# Patient Record
Sex: Male | Born: 1945 | Race: White | Hispanic: No | Marital: Single | Smoking: Former smoker
Health system: Southern US, Community
[De-identification: ages and names within clinical notes are randomized; demographics above are authoritative.]

## PROBLEM LIST (undated history)

## (undated) DIAGNOSIS — E079 Disorder of thyroid, unspecified: Secondary | ICD-10-CM

## (undated) DIAGNOSIS — M1990 Unspecified osteoarthritis, unspecified site: Secondary | ICD-10-CM

## (undated) DIAGNOSIS — A770 Spotted fever due to Rickettsia rickettsii: Secondary | ICD-10-CM

## (undated) DIAGNOSIS — I10 Essential (primary) hypertension: Secondary | ICD-10-CM

## (undated) DIAGNOSIS — I2510 Atherosclerotic heart disease of native coronary artery without angina pectoris: Secondary | ICD-10-CM

## (undated) HISTORY — PX: CARDIAC SURGERY: SHX584

## (undated) HISTORY — PX: CORONARY ARTERY BYPASS GRAFT: SHX141

## (undated) HISTORY — PX: TONSILLECTOMY: SUR1361

## (undated) HISTORY — PX: APPENDECTOMY: SHX54

## (undated) HISTORY — PX: KNEE SURGERY: SHX244

---

## 2013-12-26 ENCOUNTER — Ambulatory Visit
Admission: RE | Admit: 2013-12-26 | Discharge: 2013-12-26 | Disposition: A | Discharge status: Home / Self Care | Payer: Commercial Managed Care - HMO | Source: Ambulatory Visit | Attending: Internal Medicine | Admitting: Internal Medicine

## 2013-12-26 ENCOUNTER — Other Ambulatory Visit: Payer: Self-pay | Admitting: Internal Medicine

## 2013-12-26 DIAGNOSIS — R05 Cough: Secondary | ICD-10-CM

## 2013-12-26 DIAGNOSIS — R059 Cough, unspecified: Secondary | ICD-10-CM

## 2015-12-21 ENCOUNTER — Encounter (HOSPITAL_COMMUNITY): Payer: Self-pay | Admitting: *Deleted

## 2015-12-21 ENCOUNTER — Emergency Department (HOSPITAL_COMMUNITY)
Admission: EM | Admit: 2015-12-21 | Discharge: 2015-12-21 | Disposition: A | Discharge status: Home / Self Care | Payer: Medicare HMO | Source: Home / Self Care | Attending: Emergency Medicine | Admitting: Emergency Medicine

## 2015-12-21 DIAGNOSIS — K0889 Other specified disorders of teeth and supporting structures: Secondary | ICD-10-CM | POA: Diagnosis not present

## 2015-12-21 HISTORY — DX: Atherosclerotic heart disease of native coronary artery without angina pectoris: I25.10

## 2015-12-21 MED ORDER — HYDROCODONE-ACETAMINOPHEN 5-325 MG PO TABS: 2.0000 | ORAL_TABLET | ORAL | Status: DC | PRN

## 2015-12-21 MED ORDER — CLINDAMYCIN HCL 300 MG PO CAPS: ORAL_CAPSULE | ORAL | Status: DC

## 2015-12-21 NOTE — ED Notes (Signed)
Pt  Has  Pain  Swelling   Tenderness  To  r  Side  Face  Tongue  Is   Swelling  As   Well     Pt  Reports  A  Filling came  Out     And    He has     Pain  And  Swelling  Evident   -  He  Is  sitting  Upright on  The  Exam table  Speaking in   Complete  sentances  And  Is  In no    Severe  Distress

## 2015-12-21 NOTE — ED Provider Notes (Signed)
CSN: [id number]     Arrival date & time 12/21/15  1300 History   First MD Initiated Contact with Patient 12/21/15 1322     Chief Complaint  Patient presents with  . Dental Problem   (Consider location/radiation/quality/duration/timing/severity/associated sxs/prior Treatment) Patient is a 70 y.o. male presenting with tooth pain. The history is provided by the patient. No language interpreter was used.  Dental Pain Location:  Lower Lower teeth location:  30/RL 1st molar and 31/RL 2nd molar Quality:  Sharp Severity:  Moderate Onset quality:  Sudden Duration:  0 days Timing:  Constant Progression:  Worsening Chronicity:  New Previous work-up:  Dental exam Relieved by:  Nothing Worsened by:  Nothing tried Ineffective treatments:  None tried Associated symptoms: facial swelling   Associated symptoms: no congestion   Risk factors: lack of dental care   Risk factors: no chewing tobacco use     Past Medical History  Diagnosis Date  . Coronary artery disease    Past Surgical History  Procedure Laterality Date  . Cardiac surgery     History reviewed. No pertinent family history. Social History  Substance Use Topics  . Smoking status: Never Smoker   . Smokeless tobacco: None  . Alcohol Use: Yes    Review of Systems  HENT: Positive for facial swelling. Negative for congestion.   All other systems reviewed and are negative.   Allergies  Review of patient's allergies indicates no known allergies.  Home Medications   Prior to Admission medications   Medication Sig Start Date End Date Taking? Authorizing Provider  ASPIRIN PO Take by mouth.   Yes Historical Provider, MD  LEVOTHYROXINE SODIUM PO Take by mouth.   Yes Historical Provider, MD  LISINOPRIL PO Take by mouth.   Yes Historical Provider, MD  PREDNISONE PO Take by mouth.   Yes Historical Provider, MD  Rosuvastatin Calcium (CRESTOR PO) Take by mouth.   Yes Historical Provider, MD  clindamycin (CLEOCIN) 300 MG capsule  One po qid 12/21/15   Elson AreasLeslie K Herminia Warren, PA-C  HYDROcodone-acetaminophen (NORCO/VICODIN) 5-325 MG tablet Take 2 tablets by mouth every 4 (four) hours as needed. 12/21/15   Elson AreasLeslie K Story Conti, PA-C   Meds Ordered and Administered this Visit  Medications - No data to display  BP 157/89 mmHg  Pulse 86  Temp(Src) 98 F (36.7 C) (Oral)  Resp 16  SpO2 100% No data found.   Physical Exam  Constitutional: He appears well-developed and well-nourished.  HENT:  Head: Normocephalic and atraumatic.  Right Ear: External ear normal.  Left Ear: External ear normal.  Nose: Nose normal.  Mouth/Throat: Oropharynx is clear and moist.  Eyes: Pupils are equal, round, and reactive to light.  Neck: Normal range of motion.  Cardiovascular: Normal rate.   Pulmonary/Chest: Effort normal.  Abdominal: Soft.  Musculoskeletal: Normal range of motion.  Neurological: He is alert.  Skin: Skin is warm.  Nursing note and vitals reviewed.   ED Course  Procedures (including critical care time)  Labs Review Labs Reviewed - No data to display  Imaging Review No results found.   Visual Acuity Review  Right Eye Distance:   Left Eye Distance:   Bilateral Distance:    Right Eye Near:   Left Eye Near:    Bilateral Near:         MDM   1. Toothache    See your Dentist tomorrow for evaluation Meds ordered this encounter  Medications  . LISINOPRIL PO    Sig: Take by  mouth.  . Rosuvastatin Calcium (CRESTOR PO)    Sig: Take by mouth.  Marland Kitchen LEVOTHYROXINE SODIUM PO    Sig: Take by mouth.  Marland Kitchen PREDNISONE PO    Sig: Take by mouth.  . ASPIRIN PO    Sig: Take by mouth.  . clindamycin (CLEOCIN) 300 MG capsule    Sig: One po qid    Dispense:  28 capsule    Refill:  0    Order Specific Question:  Supervising Provider    Answer:  Linna Hoff [phone]  . HYDROcodone-acetaminophen (NORCO/VICODIN) 5-325 MG tablet    Sig: Take 2 tablets by mouth every 4 (four) hours as needed.    Dispense:  10 tablet     Refill:  0    Order Specific Question:  Supervising Provider    Answer:  Linna Hoff [phone]  An After Visit Summary was printed and given to the patient.   Lonia Skinner [facility], PA-C 12/21/15 1351  Lonia Skinner [place], [place] 12/21/15 1352

## 2015-12-21 NOTE — Discharge Instructions (Signed)

## 2016-09-07 ENCOUNTER — Other Ambulatory Visit: Payer: Self-pay | Admitting: Internal Medicine

## 2016-09-07 DIAGNOSIS — R519 Headache, unspecified: Secondary | ICD-10-CM

## 2016-09-07 DIAGNOSIS — R51 Headache: Principal | ICD-10-CM

## 2016-09-08 ENCOUNTER — Other Ambulatory Visit: Payer: Self-pay | Admitting: Internal Medicine

## 2016-09-08 DIAGNOSIS — R519 Headache, unspecified: Secondary | ICD-10-CM

## 2016-09-08 DIAGNOSIS — R42 Dizziness and giddiness: Secondary | ICD-10-CM

## 2016-09-08 DIAGNOSIS — R51 Headache: Secondary | ICD-10-CM

## 2016-09-09 ENCOUNTER — Ambulatory Visit
Admission: RE | Admit: 2016-09-09 | Discharge: 2016-09-09 | Disposition: A | Discharge status: Home / Self Care | Payer: Medicare HMO | Source: Ambulatory Visit | Attending: Internal Medicine | Admitting: Internal Medicine

## 2016-09-09 ENCOUNTER — Other Ambulatory Visit: Payer: Medicare HMO

## 2016-09-09 DIAGNOSIS — R42 Dizziness and giddiness: Secondary | ICD-10-CM

## 2016-09-09 DIAGNOSIS — R51 Headache: Secondary | ICD-10-CM

## 2016-09-09 DIAGNOSIS — R519 Headache, unspecified: Secondary | ICD-10-CM

## 2017-02-14 ENCOUNTER — Encounter (HOSPITAL_COMMUNITY): Payer: Self-pay

## 2017-02-14 DIAGNOSIS — Z5321 Procedure and treatment not carried out due to patient leaving prior to being seen by health care provider: Secondary | ICD-10-CM | POA: Insufficient documentation

## 2017-02-14 DIAGNOSIS — M79604 Pain in right leg: Secondary | ICD-10-CM | POA: Insufficient documentation

## 2017-02-14 DIAGNOSIS — M79605 Pain in left leg: Secondary | ICD-10-CM | POA: Insufficient documentation

## 2017-02-14 NOTE — ED Triage Notes (Signed)
Pt states that he is having pain in bilateral legs from the hips down, pt also has knot on the back of his R calf, no redness or warmth noted. Pt also reports having pain in lower left arm area.

## 2017-02-15 ENCOUNTER — Emergency Department (HOSPITAL_COMMUNITY)
Admission: EM | Admit: 2017-02-15 | Discharge: 2017-02-15 | Disposition: A | Discharge status: Home / Self Care | Payer: Medicare HMO | Attending: Dermatology | Admitting: Dermatology

## 2017-02-15 HISTORY — DX: Disorder of thyroid, unspecified: E07.9

## 2017-02-15 HISTORY — DX: Spotted fever due to Rickettsia rickettsii: A77.0

## 2017-02-15 HISTORY — DX: Unspecified osteoarthritis, unspecified site: M19.90

## 2017-02-15 HISTORY — DX: Essential (primary) hypertension: I10

## 2017-02-15 NOTE — ED Notes (Signed)
Pt walked up to [facility] and stated that he was going to see his doctors in the morning he is tired of waiting, pt was told he was in a room they was cleaning it and they would come get him, he stated "no thanks" and walked out.

## 2018-07-17 IMAGING — CT CT HEAD W/O CM
1 series · 16 of 30 positions shown, 20 images · non-contrast
Comparison: None.

CLINICAL DATA: 70-year-old male with right temporal headache for
several years following injury.

EXAM:
CT HEAD WITHOUT CONTRAST
TECHNIQUE: Contiguous axial images were obtained from the base of the skull
through the vertex without intravenous contrast.

[Series 2: head w/(date) · axial · 0.48mm/px · z∈[+81,+216]mm · 16 of 31 slices shown, 20 images]
[im 2/31  brain]
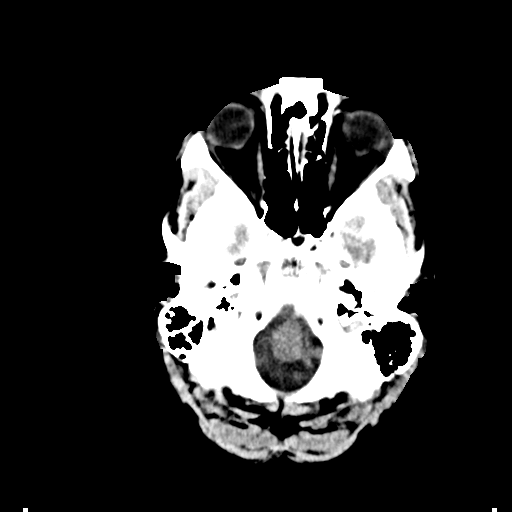
[im 2/31  bone]
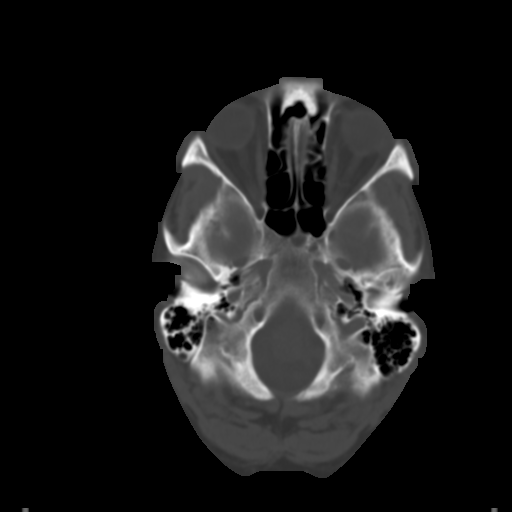
[im 4/31  brain]
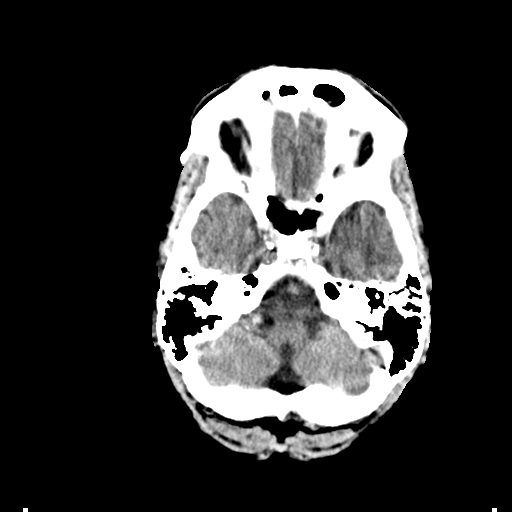
[im 6/31  brain]
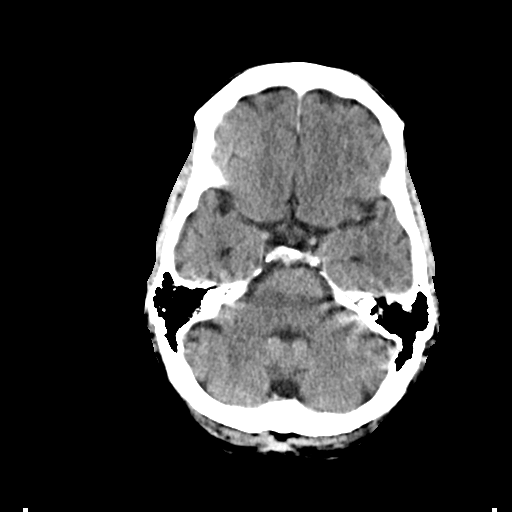
[im 8/31  brain]
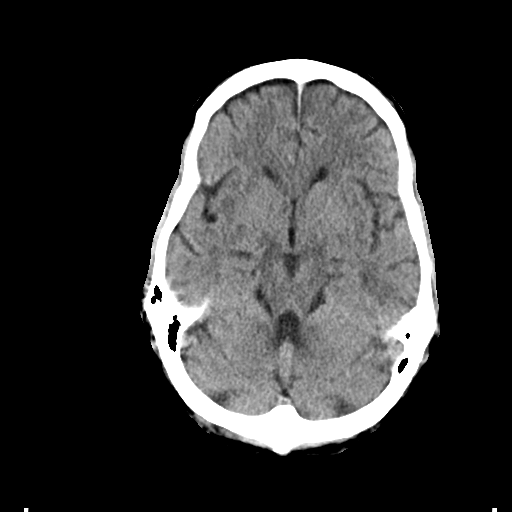
[im 9/31  brain]
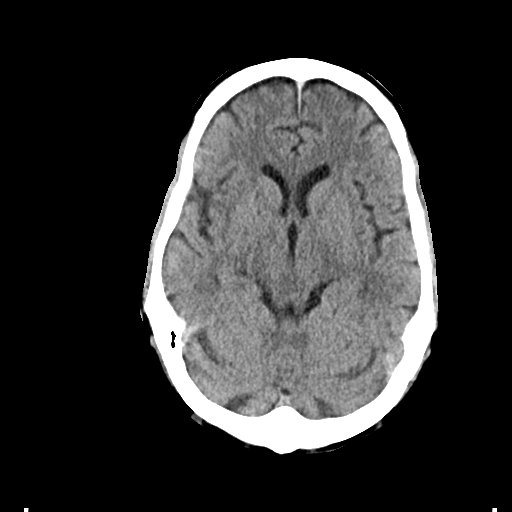
[im 9/31  bone]
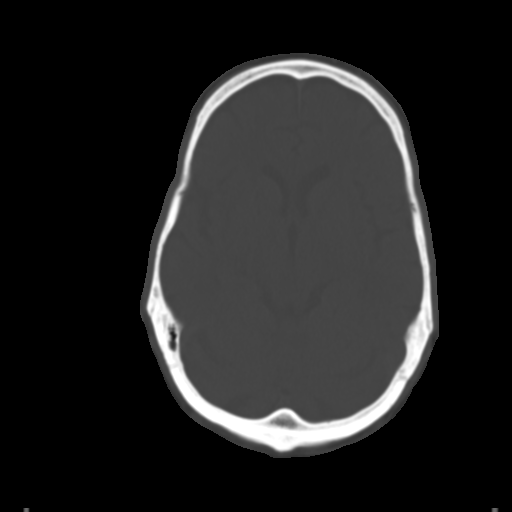
[im 11/31  brain]
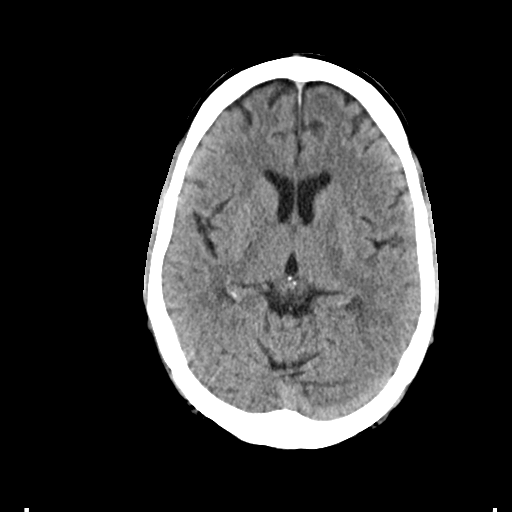
[im 13/31  brain]
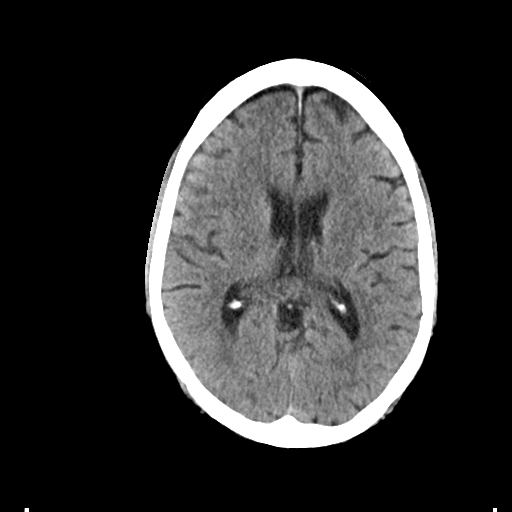
[im 15/31  brain]
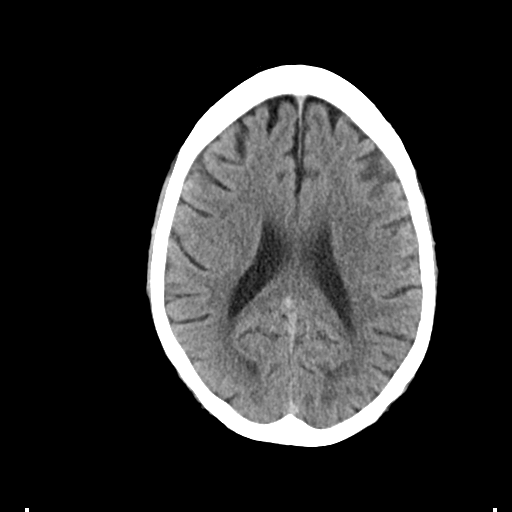
[im 16/31  brain]
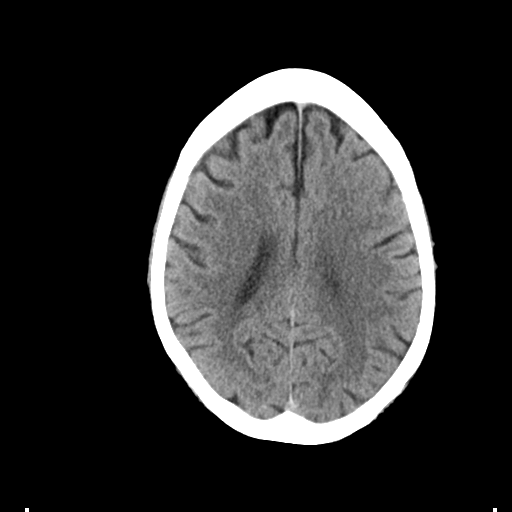
[im 16/31  bone]
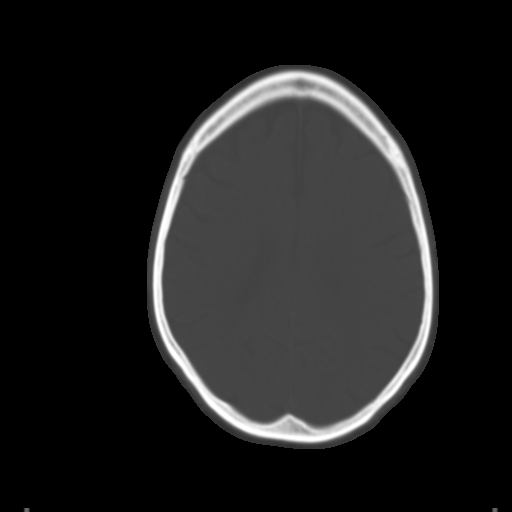
[im 18/31  brain]
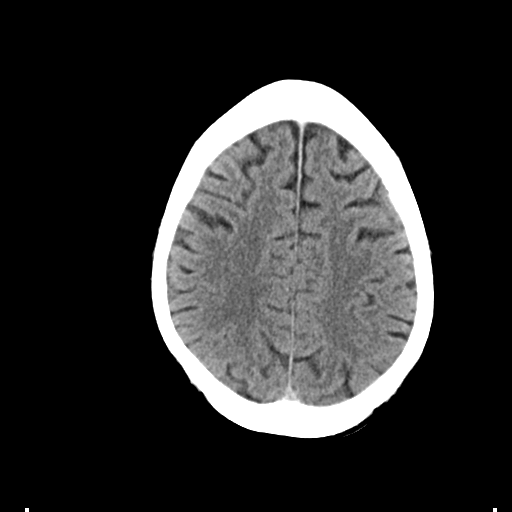
[im 20/31  brain]
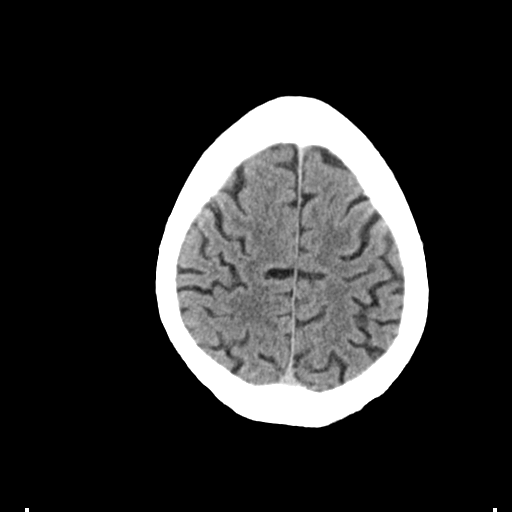
[im 22/31  brain]
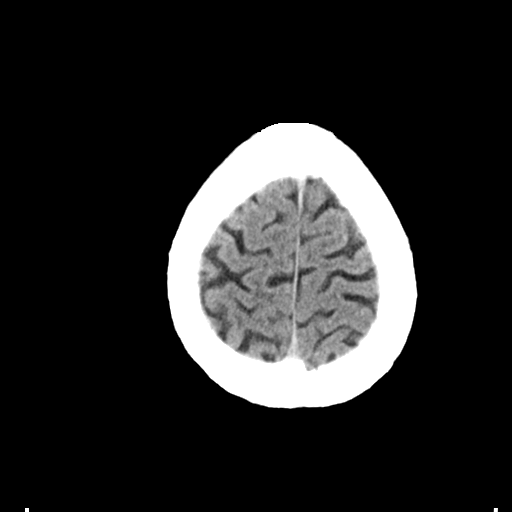
[im 23/31  brain]
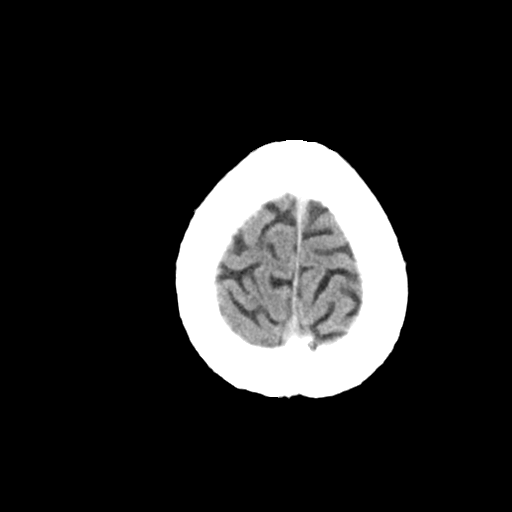
[im 23/31  bone]
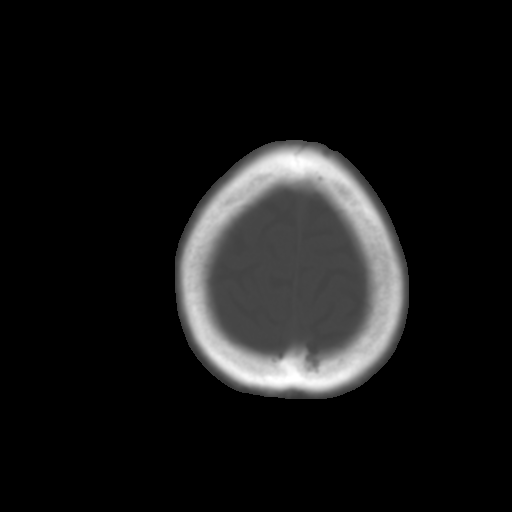
[im 25/31  brain]
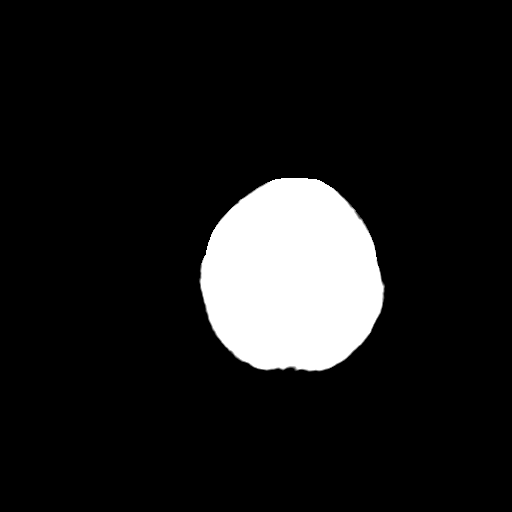
[im 27/31  brain]
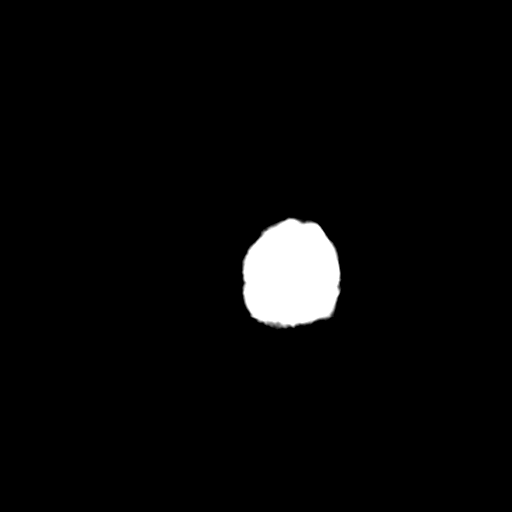
[im 29/31  brain]
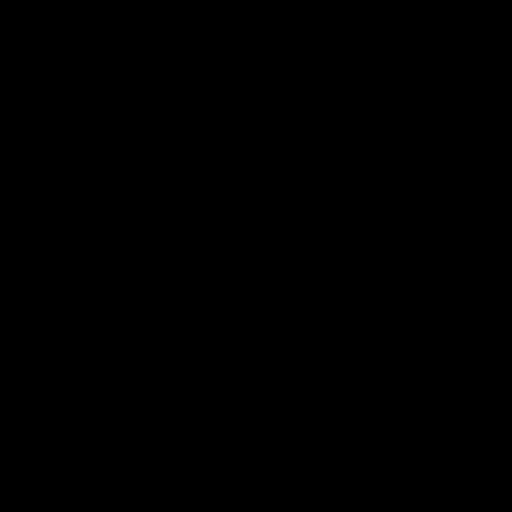

[16 of 30 positions shown; findings below may reference images not displayed]

FINDINGS: Brain: Mild generalized cerebral and cerebellar volume loss noted.
No acute intracranial abnormalities are identified, including mass
lesion or mass effect, hydrocephalus, extra-axial fluid collection,
midline shift, hemorrhage, or acute infarction..

Vascular: Mild intracranial atherosclerotic calcifications noted.

Skull: Normal. Negative for fracture or focal lesion.

Sinuses/Orbits: No acute finding.

Other: None.
IMPRESSION: No evidence of acute intracranial abnormality.

Mild generalized volume loss and intracranial atherosclerotic
calcifications.

## 2018-07-31 ENCOUNTER — Other Ambulatory Visit: Payer: Self-pay | Admitting: Cardiology

## 2018-07-31 NOTE — Telephone Encounter (Signed)
° ° °  1. Which medications need to be refilled? (please list name of each medication and dose if known) amlodipine 5mg  tablet one daily  2. Which pharmacy/location (including street and city if local pharmacy) is medication to be sent to? [facility] [phone]  3. Do they need a 30 day or 90 day supply? 30

## 2018-08-01 ENCOUNTER — Other Ambulatory Visit: Payer: Self-pay | Admitting: Cardiology

## 2018-08-01 MED ORDER — AMLODIPINE BESYLATE 5 MG PO TABS: 5.0000 mg | ORAL_TABLET | Freq: Every day | ORAL | 0 refills | Status: DC

## 2018-08-01 NOTE — Telephone Encounter (Signed)
30 day supply sent in. Okayed by Dr. Bing Matter for Dr. Donnie Aho patient

## 2018-10-24 ENCOUNTER — Ambulatory Visit: Payer: Medicare HMO | Admitting: Cardiology

## 2018-11-16 ENCOUNTER — Encounter: Payer: Self-pay | Admitting: Cardiology

## 2018-11-16 ENCOUNTER — Ambulatory Visit: Payer: Medicare HMO | Admitting: Cardiology

## 2018-11-16 ENCOUNTER — Telehealth: Payer: Self-pay | Admitting: Cardiology

## 2018-11-16 VITALS — BP 160/80 | HR 63 | Ht 66.0 in | Wt 225.8 lb

## 2018-11-16 DIAGNOSIS — E785 Hyperlipidemia, unspecified: Secondary | ICD-10-CM | POA: Diagnosis not present

## 2018-11-16 DIAGNOSIS — I10 Essential (primary) hypertension: Secondary | ICD-10-CM

## 2018-11-16 DIAGNOSIS — I25119 Atherosclerotic heart disease of native coronary artery with unspecified angina pectoris: Secondary | ICD-10-CM | POA: Diagnosis not present

## 2018-11-16 DIAGNOSIS — Z951 Presence of aortocoronary bypass graft: Secondary | ICD-10-CM | POA: Diagnosis not present

## 2018-11-16 DIAGNOSIS — I2510 Atherosclerotic heart disease of native coronary artery without angina pectoris: Secondary | ICD-10-CM | POA: Diagnosis not present

## 2018-11-16 MED ORDER — NITROGLYCERIN 0.4 MG SL SUBL: 0.4000 mg | SUBLINGUAL_TABLET | SUBLINGUAL | 5 refills | Status: DC | PRN

## 2018-11-16 NOTE — Progress Notes (Signed)
Cardiology Office Note:    Date:  11/16/2018   ID:  Justin Johnston, DOB 11/22/1945, MRN [id number]  PCP:  Burton Apleyoberts, Ronald, MD  Cardiologist:  Gypsy Balsamobert Niki Payment, MD    Referring MD: No ref. provider found   Chief Complaint  Patient presents with  . Follow-up  Doing well just regular follow-up  History of Present Illness:    Justin Ketoimothy Schouten is a 73 y.o. male with coronary artery disease status post coronary artery bypass grafting 2002 since that time he has been doing well.  Apparently last nuclear stress test done was in 2015 which showed no evidence of ischemia preserved ejection fraction.  He complaining of getting weak and tired he thinks maybe he simply getting ALT still very active try to go and exercise on a regular basis he can swim some I does have some chronic problem with his knees event him from exercising more aggressively.  [place] ready [place] go to [place].  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  He visited [place] few times.  He was in [place] few times. Denies have any chest pain tightness squeezing pressure burning chest does have exertional shortness of breath and fatigue and tiredness seems to be leading complaint.  Past Medical History:  Diagnosis Date  . Arthritis   . Coronary artery disease   . Hypertension   . [facility]   . Thyroid disease     Past Surgical History:  Procedure Laterality Date  . APPENDECTOMY    . CARDIAC SURGERY    . CORONARY ARTERY BYPASS GRAFT    . KNEE SURGERY    . TONSILLECTOMY      Current Medications: Current Meds  Medication Sig  . amLODipine (NORVASC) 5 MG tablet Take 1 tablet (5 mg total) by mouth daily.  . ASPIRIN PO Take by mouth.  . B Complex-C (SUPER B COMPLEX PO) Take by mouth.  Marland Kitchen. HYDROcodone-acetaminophen (NORCO/VICODIN) 5-325 MG tablet Take 2 tablets by mouth every 4 (four) hours as needed.  Marland Kitchen. levothyroxine (SYNTHROID, LEVOTHROID) 50 MCG tablet Take  50 mcg by mouth daily before breakfast.   . lisinopril (PRINIVIL,ZESTRIL) 20 MG tablet Take 1 tablet by mouth daily.   . Multiple Vitamins-Minerals (PRESERVISION AREDS 2 PO) Take by mouth.  . nitroGLYCERIN (NITROSTAT) 0.4 MG SL tablet Place 1 tablet (0.4 mg total) under the tongue every 5 (five) minutes as needed for chest pain.  . rosuvastatin (CRESTOR) 20 MG tablet Take 0.5 tablets by mouth daily.   . [DISCONTINUED] nitroGLYCERIN (NITROSTAT) 0.4 MG SL tablet Place 0.4 mg under the tongue every 5 (five) minutes as needed for chest pain.     Allergies:   Patient has no known allergies.   Social History   Socioeconomic History  . Marital status: Single    Spouse name: Not on file  . Number of children: Not on file  . Years of education: Not on file  . Highest education level: Not on file  Occupational History  . Not on file  Social Needs  . Financial resource strain: Not on file  . Food insecurity:    Worry: Not on file    Inability: Not on file  . Transportation needs:    Medical: Not on file    Non-medical: Not on file  Tobacco Use  . Smoking status: Former Games developermoker  . Smokeless tobacco: Never Used  Substance and Sexual Activity  . Alcohol use: Yes  .  Drug use: No  . Sexual activity: Not on file  Lifestyle  . Physical activity:    Days per week: Not on file    Minutes per session: Not on file  . Stress: Not on file  Relationships  . Social connections:    Talks on phone: Not on file    Gets together: Not on file    Attends religious service: Not on file    Active member of club or organization: Not on file    Attends meetings of clubs or organizations: Not on file    Relationship status: Not on file  Other Topics Concern  . Not on file  Social History Narrative  . Not on file     Family History: The patient's family history includes Cancer in his father and mother. ROS:   Please see the history of present illness.    All 14 point review of systems negative  except as described per history of present illness  EKGs/Labs/Other Studies Reviewed:    Normal sinus rhythm normal P interval poor R wave progression anterior precordium nonspecific ST segment changes  Recent Labs: No results found for requested labs within last 8760 hours.  Recent Lipid Panel No results found for: CHOL, TRIG, HDL, CHOLHDL, VLDL, LDLCALC, LDLDIRECT  Physical Exam:    VS:  BP (!) 160/80   Pulse 63   Ht 5\' 6"  (1.676 m)   Wt 225 lb 12.8 oz (102.4 kg)   SpO2 98%   BMI 36.45 kg/m     Wt Readings from Last 3 Encounters:  11/16/18 225 lb 12.8 oz (102.4 kg)  02/14/17 216 lb (98 kg)     GEN:  Well nourished, well developed in no acute distress HEENT: Normal NECK: No JVD; No carotid bruits LYMPHATICS: No lymphadenopathy CARDIAC: RRR, no murmurs, no rubs, no gallops RESPIRATORY:  Clear to auscultation without rales, wheezing or rhonchi  ABDOMEN: Soft, non-tender, non-distended MUSCULOSKELETAL:  No edema; No deformity  SKIN: Warm and dry LOWER EXTREMITIES: no swelling NEUROLOGIC:  Alert and oriented x 3 PSYCHIATRIC:  Normal affect   ASSESSMENT:    1. Coronary artery disease with angina pectoris, unspecified vessel or lesion type, unspecified whether native or transplanted heart (HCC)   2. Coronary artery disease involving native coronary artery of native heart without angina pectoris   3. Status post coronary artery bypass graft   4. Dyslipidemia   5. Essential hypertension    PLAN:    In order of problems listed above:  1. Coronary disease stable however with his symptoms of fatigue tiredness I think it would be reasonable to do echocardiogram to assess left ventricular ejection fraction as well as stress test to look for any reactivation of the heart trouble.  Of course the key also is modification of his risk factors.  I will call his primary care physician to get his fasting lipid profile. 2. Essential hypertension blood pressure well controlled continue  present management. 3. Dyslipidemia will call his primary care physician to get fasting lipid profile he is only on moderate intensity statin according to guidelines he need to be on high intensity statin. 4. We talked at length about healthy lifestyle we discussed the basic of Mediterranean diet as well as need for exercises he is doing it.  He is an ex-smoker but does not smoke anymore.   Medication Adjustments/Labs and Tests Ordered: Current medicines are reviewed at length with the patient today.  Concerns regarding medicines are outlined above.  Orders Placed  This Encounter  Procedures  . EKG 12-Lead  . ECHOCARDIOGRAM STRESS TEST  . ECHOCARDIOGRAM COMPLETE   Medication changes:  Meds ordered this encounter  Medications  . nitroGLYCERIN (NITROSTAT) 0.4 MG SL tablet    Sig: Place 1 tablet (0.4 mg total) under the tongue every 5 (five) minutes as needed for chest pain.    Dispense:  25 tablet    Refill:  5    Signed, Georgeanna Lea, MD, [facility] 11/16/2018 11:50 AM    [facility]

## 2018-11-16 NOTE — Patient Instructions (Signed)
Medication Instructions:  Your physician recommends that you continue on your current medications as directed. Please refer to the Current Medication list given to you today.  If you need a refill on your cardiac medications before your next appointment, please call your pharmacy.   Lab work: None.  If you have labs (blood work) drawn today and your tests are completely normal, you will receive your results only by: Marland Kitchen. MyChart Message (if you have MyChart) OR . A paper copy in the mail If you have any lab test that is abnormal or we need to change your treatment, we will call you to review the results.  Testing/Procedures: Your physician has requested that you have a stress echocardiogram. For further information please visit [email or web address]. Please follow instruction sheet as given.  Your physician has requested that you have an echocardiogram. Echocardiography is a painless test that uses sound waves to create images of your heart. It provides your doctor with information about the size and shape of your heart and how well your heart's chambers and valves are working. This procedure takes approximately one hour. There are no restrictions for this procedure.    Follow-Up: At [facility], you and your health needs are our priority.  As part of our continuing mission to provide you with exceptional heart care, we have created designated Provider Care Teams.  These Care Teams include your primary Cardiologist (physician) and Advanced Practice Providers (APPs -  Physician Assistants and Nurse Practitioners) who all work together to provide you with the care you need, when you need it. You will need a follow up appointment in 3 months.  Please call our office 2 months in advance to schedule this appointment.  You may see No primary care provider on file. or another member of our [facility] Provider Team in [place]: Norman HerrlichBrian Munley, MD . Belva Cromeajan Revankar, MD  Any Other Special  Instructions Will Be Listed Below (If Applicable).   Echocardiogram An echocardiogram is a procedure that uses painless sound waves (ultrasound) to produce an image of the heart. Images from an echocardiogram can provide important information about:  Signs of coronary artery disease (CAD).  Aneurysm detection. An aneurysm is a weak or damaged part of an artery wall that bulges out from the normal force of blood pumping through the body.  Heart size and shape. Changes in the size or shape of the heart can be associated with certain conditions, including heart failure, aneurysm, and CAD.  Heart muscle function.  Heart valve function.  Signs of a past heart attack.  Fluid buildup around the heart.  Thickening of the heart muscle.  A tumor or infectious growth around the heart valves. Tell a health care provider about:  Any allergies you have.  All medicines you are taking, including vitamins, herbs, eye drops, creams, and over-the-counter medicines.  Any blood disorders you have.  Any surgeries you have had.  Any medical conditions you have.  Whether you are pregnant or may be pregnant. What are the risks? Generally, this is a safe procedure. However, problems may occur, including:  Allergic reaction to dye (contrast) that may be used during the procedure. What happens before the procedure? No specific preparation is needed. You may eat and drink normally. What happens during the procedure?   An IV tube may be inserted into one of your veins.  You may receive contrast through this tube. A contrast is an injection that improves the quality of the pictures from your heart.  A gel will be applied to your chest.  A wand-like tool (transducer) will be moved over your chest. The gel will help to transmit the sound waves from the transducer.  The sound waves will harmlessly bounce off of your heart to allow the heart images to be captured in real-time motion. The images  will be recorded on a computer. The procedure may vary among health care providers and hospitals. What happens after the procedure?  You may return to your normal, everyday life, including diet, activities, and medicines, unless your health care provider tells you not to do that. Summary  An echocardiogram is a procedure that uses painless sound waves (ultrasound) to produce an image of the heart.  Images from an echocardiogram can provide important information about the size and shape of your heart, heart muscle function, heart valve function, and fluid buildup around your heart.  You do not need to do anything to prepare before this procedure. You may eat and drink normally.  After the echocardiogram is completed, you may return to your normal, everyday life, unless your health care provider tells you not to do that. This information is not intended to replace advice given to you by your health care provider. Make sure you discuss any questions you have with your health care provider. Document Released: 09/10/2000 Document Revised: 10/16/2016 Document Reviewed: 10/16/2016 Elsevier Interactive Patient Education  2019 [employer].   Exercise Stress Echocardiogram  An exercise stress echocardiogram is a test to check how well your heart is working. This test uses sound waves (ultrasound) and a computer to make images of your heart before and after exercise. Ultrasound images that are taken before you exercise (your resting echocardiogram) will show how much blood is getting to your heart muscle and how well your heart muscle and heart valves are functioning. During the next part of this test, you will walk on a treadmill or ride a stationary bike to see how exercise affects your heart. While you exercise, the electrical activity of your heart will be monitored with an electrocardiogram (ECG). Your blood pressure will also be monitored. You may have this test if you:  Have chest pain or  other symptoms of a heart problem.  Recently had a heart attack or heart surgery.  Have heart valve problems.  Have a condition that causes narrowing of the blood vessels that supply your heart (coronary artery disease).  Have a high risk of heart disease and are starting a new exercise program.  Have a high risk of heart disease and need to have major surgery. Tell a health care provider about:  Any allergies you have.  All medicines you are taking, including vitamins, herbs, eye drops, creams, and over-the-counter medicines.  Any problems you or family members have had with anesthetic medicines.  Any blood disorders you have.  Any surgeries you have had.  Any medical conditions you have.  Whether you are pregnant or may be pregnant. What are the risks? Generally, this is a safe procedure. However, problems may occur, including:  Chest pain.  Dizziness or light-headedness.  Shortness of breath.  Increased or irregular heartbeat (palpitations).  Nausea or vomiting.  Heart attack (very rare). What happens before the procedure?  Follow instructions from your health care provider about eating or drinking restrictions. You may be asked to avoid all forms of caffeine for 24 hours before your procedure, or as told by your health care provider.  Ask your health care provider about changing or stopping  your regular medicines. This is especially important if you are taking diabetes medicines or blood thinners.  If you use an inhaler, bring it with you to the test.  Wear loose, comfortable clothing and walking shoes.  Do notuse any products that contain nicotine or tobacco, such as cigarettes and e-cigarettes, for 4 hours before the test or as told by your health care provider. If you need help quitting, ask your health care provider. What happens during the procedure?  You will take off your clothes from the waist up and put on a hospital gown.  A technician will place  electrodes on your chest.  A blood pressure cuff will be placed on your arm.  You will lie down on a table for an ultrasound exam before you exercise. Gel will be rubbed on your chest, and a handheld device (transducer) will be pressed against your chest and moved over your heart.  Then, you will start exercising by walking on a treadmill or pedaling a stationary bicycle.  Your blood pressure and heart rhythm will be monitored while you exercise.  The exercise will gradually get harder or faster.  You will exercise until: ? Your heart reaches a target level. ? You are too tired to continue. ? You cannot continue because of chest pain, weakness, or dizziness.  You will have another ultrasound exam after you stop exercising. The procedure may vary among health care providers and hospitals. What happens after the procedure?  Your heart rate and blood pressure will be monitored until they return to your normal levels. Summary  An exercise stress echocardiogram is a test that uses ultrasound to check how well your heart works before and after exercise.  Before the test, follow instructions from your health care provider about stopping medications, avoiding nicotine and tobacco, and avoiding certain foods and drinks.  During the test, your blood pressure and heart rhythm will be monitored while you exercise on a treadmill or stationary bicycle. This information is not intended to replace advice given to you by your health care provider. Make sure you discuss any questions you have with your health care provider. Document Released: 09/17/2004 Document Revised: 05/05/2016 Document Reviewed: 05/05/2016 Elsevier Interactive Patient Education  2019 [employer].

## 2018-11-27 NOTE — Telephone Encounter (Signed)
error 

## 2018-12-11 ENCOUNTER — Other Ambulatory Visit: Payer: Self-pay

## 2018-12-11 ENCOUNTER — Ambulatory Visit (HOSPITAL_BASED_OUTPATIENT_CLINIC_OR_DEPARTMENT_OTHER)
Admission: RE | Admit: 2018-12-11 | Discharge: 2018-12-11 | Disposition: A | Discharge status: Home / Self Care | Payer: Medicare HMO | Source: Ambulatory Visit | Attending: Cardiology | Admitting: Cardiology

## 2018-12-11 DIAGNOSIS — I25119 Atherosclerotic heart disease of native coronary artery with unspecified angina pectoris: Secondary | ICD-10-CM | POA: Insufficient documentation

## 2018-12-11 MED ORDER — PERFLUTREN LIPID MICROSPHERE
1.0000 mL | INTRAVENOUS | Status: AC | PRN
  Administered 2018-12-11: 3 mL via INTRAVENOUS
  Filled 2018-12-11: qty 10

## 2018-12-11 NOTE — Progress Notes (Signed)
  Echocardiogram 2D Echocardiogram has been performed.  Justin Johnston T Cochise Dinneen 12/11/2018, 10:09 AM

## 2018-12-12 ENCOUNTER — Other Ambulatory Visit (HOSPITAL_BASED_OUTPATIENT_CLINIC_OR_DEPARTMENT_OTHER): Payer: Medicare HMO

## 2018-12-18 ENCOUNTER — Other Ambulatory Visit (HOSPITAL_BASED_OUTPATIENT_CLINIC_OR_DEPARTMENT_OTHER): Payer: Medicare HMO

## 2018-12-19 ENCOUNTER — Other Ambulatory Visit (HOSPITAL_BASED_OUTPATIENT_CLINIC_OR_DEPARTMENT_OTHER): Payer: Medicare HMO

## 2019-01-08 ENCOUNTER — Telehealth: Payer: Self-pay | Admitting: Emergency Medicine

## 2019-01-08 NOTE — Telephone Encounter (Signed)
Called patient and informed him that his upcoming stress echo will be rescheduled and he will be contacted once we are able to reschedule. Patient verbally understands.

## 2019-01-23 ENCOUNTER — Ambulatory Visit (HOSPITAL_BASED_OUTPATIENT_CLINIC_OR_DEPARTMENT_OTHER): Payer: Medicare HMO

## 2019-02-12 ENCOUNTER — Telehealth: Payer: Self-pay | Admitting: Cardiology

## 2019-02-12 NOTE — Telephone Encounter (Signed)
Virtual Visit Pre-Appointment Phone Call  "(Name), I am calling you today to discuss your upcoming appointment. We are currently trying to limit exposure to the virus that causes COVID-19 by seeing patients at home rather than in the office."  1. "What is the BEST phone number to call the day of the visit?" - include this in appointment notes  2. Do you have or have access to (through a family member/friend) a smartphone with video capability that we can use for your visit?" a. If yes - list this number in appt notes as cell (if different from BEST phone #) and list the appointment type as a VIDEO visit in appointment notes b. If no - list the appointment type as a PHONE visit in appointment notes  3. Confirm consent - "In the setting of the current Covid19 crisis, you are scheduled for a (phone or video) visit with your provider on (date) at (time).  Just as we do with many in-office visits, in order for you to participate in this visit, we must obtain consent.  If you'd like, I can send this to your mychart (if signed up) or email for you to review.  Otherwise, I can obtain your verbal consent now.  All virtual visits are billed to your insurance company just like a normal visit would be.  By agreeing to a virtual visit, we'd like you to understand that the technology does not allow for your provider to perform an examination, and thus may limit your provider's ability to fully assess your condition. If your provider identifies any concerns that need to be evaluated in person, we will make arrangements to do so.  Finally, though the technology is pretty good, we cannot assure that it will always work on either your or our end, and in the setting of a video visit, we may have to convert it to a phone-only visit.  In either situation, we cannot ensure that we have a secure connection.  Are you willing to proceed?" STAFF: Did the patient verbally acknowledge consent to telehealth visit? Document  YES/NO here: YES  4. Advise patient to be prepared - "Two hours prior to your appointment, go ahead and check your blood pressure, pulse, oxygen saturation, and your weight (if you have the equipment to check those) and write them all down. When your visit starts, your provider will ask you for this information. If you have an Apple Watch or Kardia device, please plan to have heart rate information ready on the day of your appointment. Please have a pen and paper handy nearby the day of the visit as well."  5. Give patient instructions for MyChart download to smartphone OR Doximity/Doxy.me as below if video visit (depending on what platform provider is using)  6. Inform patient they will receive a phone call 15 minutes prior to their appointment time (may be from unknown caller ID) so they should be prepared to answer    TELEPHONE CALL NOTE  Jayeden Dutson has been deemed a candidate for a follow-up tele-health visit to limit community exposure during the Covid-19 pandemic. I spoke with the patient via phone to ensure availability of phone/video source, confirm preferred email & phone number, and discuss instructions and expectations.  I reminded Justin Johnston to be prepared with any vital sign and/or heart rhythm information that could potentially be obtained via home monitoring, at the time of his visit. I reminded Justin Johnston to expect a phone call prior to his visit.  Justin Johnston 02/12/2019 9:58 AM   INSTRUCTIONS FOR DOWNLOADING THE MYCHART APP TO SMARTPHONE  - The patient must first make sure to have activated MyChart and know their login information - If Apple, go to [employer] Store and type in MyChart in the search bar and download the app. If Android, ask patient to go to [employer] Play Store and type in [place] in the search bar and download the app. The app is free but as with any other app downloads, their phone may require them to verify saved payment information or Apple/Android  password.  - The patient will need to then log into the app with their MyChart username and password, and select [facility] as their healthcare provider to link the account. When it is time for your visit, go to the MyChart app, find appointments, and click Begin Video Visit. Be sure to Select Allow for your device to access the Microphone and Camera for your visit. You will then be connected, and your provider will be with you shortly.  **If they have any issues connecting, or need assistance please contact MyChart service desk [phone])**  **If using a computer, in order to ensure the best quality for their visit they will need to use either of the following Internet Browsers: [employer] Edge, or Google Chrome**  IF USING DOXIMITY or DOXY.ME - The patient will receive a link just prior to their visit by text.     FULL LENGTH CONSENT FOR TELE-HEALTH VISIT   I hereby voluntarily request, consent and authorize [facility] and its employed or contracted physicians, physician assistants, nurse practitioners or other licensed health care professionals (the Practitioner), to provide me with telemedicine health care services (the Services") as deemed necessary by the treating Practitioner. I acknowledge and consent to receive the Services by the Practitioner via telemedicine. I understand that the telemedicine visit will involve communicating with the Practitioner through live audiovisual communication technology and the disclosure of certain medical information by electronic transmission. I acknowledge that I have been given the opportunity to request an in-person assessment or other available alternative prior to the telemedicine visit and am voluntarily participating in the telemedicine visit.  I understand that I have the right to withhold or withdraw my consent to the use of telemedicine in the course of my care at any time, without affecting my right to future care or treatment,  and that the Practitioner or I may terminate the telemedicine visit at any time. I understand that I have the right to inspect all information obtained and/or recorded in the course of the telemedicine visit and may receive copies of available information for a reasonable fee.  I understand that some of the potential risks of receiving the Services via telemedicine include:   Delay or interruption in medical evaluation due to technological equipment failure or disruption;  Information transmitted may not be sufficient (e.g. poor resolution of images) to allow for appropriate medical decision making by the Practitioner; and/or   In rare instances, security protocols could fail, causing a breach of personal health information.  Furthermore, I acknowledge that it is my responsibility to provide information about my medical history, conditions and care that is complete and accurate to the best of my ability. I acknowledge that Practitioner's advice, recommendations, and/or decision may be based on factors not within their control, such as incomplete or inaccurate data provided by me or distortions of diagnostic images or specimens that may result from electronic transmissions. I understand that the  practice of medicine is not an Chief Strategy Officer and that Practitioner makes no warranties or guarantees regarding treatment outcomes. I acknowledge that I will receive a copy of this consent concurrently upon execution via email to the email address I last provided but may also request a printed copy by calling the office of [place].    I understand that my insurance will be billed for this visit.   I have read or had this consent read to me.  I understand the contents of this consent, which adequately explains the benefits and risks of the Services being provided via telemedicine.   I have been provided ample opportunity to ask questions regarding this consent and the Services and have had my questions  answered to my satisfaction.  I give my informed consent for the services to be provided through the use of telemedicine in my medical care  By participating in this telemedicine visit I agree to the above.

## 2019-02-20 ENCOUNTER — Telehealth (INDEPENDENT_AMBULATORY_CARE_PROVIDER_SITE_OTHER): Payer: Medicare HMO | Admitting: Cardiology

## 2019-02-20 ENCOUNTER — Other Ambulatory Visit: Payer: Self-pay

## 2019-02-20 ENCOUNTER — Encounter: Payer: Self-pay | Admitting: Cardiology

## 2019-02-20 DIAGNOSIS — E785 Hyperlipidemia, unspecified: Secondary | ICD-10-CM | POA: Diagnosis not present

## 2019-02-20 DIAGNOSIS — I10 Essential (primary) hypertension: Secondary | ICD-10-CM | POA: Diagnosis not present

## 2019-02-20 DIAGNOSIS — Z951 Presence of aortocoronary bypass graft: Secondary | ICD-10-CM

## 2019-02-20 DIAGNOSIS — I2510 Atherosclerotic heart disease of native coronary artery without angina pectoris: Secondary | ICD-10-CM | POA: Diagnosis not present

## 2019-02-20 NOTE — Patient Instructions (Addendum)
Medication Instructions:  Your physician recommends that you continue on your current medications as directed. Please refer to the Current Medication list given to you today.  If you need a refill on your cardiac medications before your next appointment, please call your pharmacy.   Lab work: None.  If you have labs (blood work) drawn today and your tests are completely normal, you will receive your results only by: Marland Kitchen. MyChart Message (if you have MyChart) OR . A paper copy in the mail If you have any lab test that is abnormal or we need to change your treatment, we will call you to review the results.  Testing/Procedures: Your physician has requested that you have a stress echocardiogram. For further information please visit [email or web address]. Please follow instruction sheet as given.  On the day of the stress echo: No food/drink/ or tobacco products for 4 hours before the test.  Dress prepared to exercise.  Please call office 24 hours before to avoid any cancellation fees.    Follow-Up: At [facility], you and your health needs are our priority.  As part of our continuing mission to provide you with exceptional heart care, we have created designated Provider Care Teams.  These Care Teams include your primary Cardiologist (physician) and Advanced Practice Providers (APPs -  Physician Assistants and Nurse Practitioners) who all work together to provide you with the care you need, when you need it. You will need a follow up appointment in 3 months.  Please call our office 2 months in advance to schedule this appointment.  You may see No primary care provider on file. or another member of our [facility] Provider Team in [place]: Norman HerrlichBrian Munley, MD . Belva Cromeajan Revankar, MD  Any Other Special Instructions Will Be Listed Below (If Applicable).   Exercise Stress Echocardiogram  An exercise stress echocardiogram is a test to check how well your heart is working. This test uses sound  waves (ultrasound) and a computer to make images of your heart before and after exercise. Ultrasound images that are taken before you exercise (your resting echocardiogram) will show how much blood is getting to your heart muscle and how well your heart muscle and heart valves are functioning. During the next part of this test, you will walk on a treadmill or ride a stationary bike to see how exercise affects your heart. While you exercise, the electrical activity of your heart will be monitored with an electrocardiogram (ECG). Your blood pressure will also be monitored. You may have this test if you:  Have chest pain or other symptoms of a heart problem.  Recently had a heart attack or heart surgery.  Have heart valve problems.  Have a condition that causes narrowing of the blood vessels that supply your heart (coronary artery disease).  Have a high risk of heart disease and are starting a new exercise program.  Have a high risk of heart disease and need to have major surgery. Tell a health care provider about:  Any allergies you have.  All medicines you are taking, including vitamins, herbs, eye drops, creams, and over-the-counter medicines.  Any problems you or family members have had with anesthetic medicines.  Any blood disorders you have.  Any surgeries you have had.  Any medical conditions you have.  Whether you are pregnant or may be pregnant. What are the risks? Generally, this is a safe procedure. However, problems may occur, including:  Chest pain.  Dizziness or light-headedness.  Shortness of breath.  Increased or  irregular heartbeat (palpitations).  Nausea or vomiting.  Heart attack (very rare). What happens before the procedure?  Follow instructions from your health care provider about eating or drinking restrictions. You may be asked to avoid all forms of caffeine for 24 hours before your procedure, or as told by your health care provider.  Ask your  health care provider about changing or stopping your regular medicines. This is especially important if you are taking diabetes medicines or blood thinners.  If you use an inhaler, bring it with you to the test.  Wear loose, comfortable clothing and walking shoes.  Do notuse any products that contain nicotine or tobacco, such as cigarettes and e-cigarettes, for 4 hours before the test or as told by your health care provider. If you need help quitting, ask your health care provider. What happens during the procedure?  You will take off your clothes from the waist up and put on a hospital gown.  A technician will place electrodes on your chest.  A blood pressure cuff will be placed on your arm.  You will lie down on a table for an ultrasound exam before you exercise. Gel will be rubbed on your chest, and a handheld device (transducer) will be pressed against your chest and moved over your heart.  Then, you will start exercising by walking on a treadmill or pedaling a stationary bicycle.  Your blood pressure and heart rhythm will be monitored while you exercise.  The exercise will gradually get harder or faster.  You will exercise until: ? Your heart reaches a target level. ? You are too tired to continue. ? You cannot continue because of chest pain, weakness, or dizziness.  You will have another ultrasound exam after you stop exercising. The procedure may vary among health care providers and hospitals. What happens after the procedure?  Your heart rate and blood pressure will be monitored until they return to your normal levels. Summary  An exercise stress echocardiogram is a test that uses ultrasound to check how well your heart works before and after exercise.  Before the test, follow instructions from your health care provider about stopping medications, avoiding nicotine and tobacco, and avoiding certain foods and drinks.  During the test, your blood pressure and heart  rhythm will be monitored while you exercise on a treadmill or stationary bicycle. This information is not intended to replace advice given to you by your health care provider. Make sure you discuss any questions you have with your health care provider. Document Released: 09/17/2004 Document Revised: 05/05/2016 Document Reviewed: 05/05/2016 Elsevier Interactive Patient Education  2019 [employer].

## 2019-02-20 NOTE — Progress Notes (Signed)
Virtual Visit via Telephone Note   This visit type was conducted due to national recommendations for restrictions regarding the COVID-19 Pandemic (e.g. social distancing) in an effort to limit this patient's exposure and mitigate transmission in our community.  Due to his co-morbid illnesses, this patient is at least at moderate risk for complications without adequate follow up.  This format is felt to be most appropriate for this patient at this time.  The patient did not have access to video technology/had technical difficulties with video requiring transitioning to audio format only (telephone).  All issues noted in this document were discussed and addressed.  No physical exam could be performed with this format.  Please refer to the patient's chart for his  consent to telehealth for [facility].  Evaluation Performed:  Follow-up visit  This visit type was conducted due to national recommendations for restrictions regarding the COVID-19 Pandemic (e.g. social distancing).  This format is felt to be most appropriate for this patient at this time.  All issues noted in this document were discussed and addressed.  No physical exam was performed (except for noted visual exam findings with Video Visits).  Please refer to the patient's chart (MyChart message for video visits and phone note for telephone visits) for the patient's consent to telehealth for [facility].  Date:  02/20/2019  ID: Justin Johnston, DOB 03-10-1946, MRN [id number]   Patient Location: [address] [place] [address]   Provider location:   [facility] [place] [facility]  PCP:  Burton Apley, MD  Cardiologist:  Gypsy Balsam, MD     Chief Complaint: Doing very well  History of Present Illness:    Justin Johnston is a 73 y.o. male  who presents via audio/video conferencing for a telehealth visit today.  With coronary artery disease status post coronary artery bypass graft many years ago stress test done  in 2015 showing no evidence of ischemia.  Overall doing very well recently traveled to [place] and enjoyed himself there.  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  Denies having a chest pain tightness squeezing pressure burning chest there is some exertional shortness of breath he is traveling travel through some weight.   The patient does not have symptoms concerning for COVID-19 infection (fever, chills, cough, or new SHORTNESS OF BREATH).    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 Arthritis   . Coronary artery disease   . Hypertension   . [facility] spotted fever   . Thyroid disease     Past Surgical History:  Procedure Laterality Date  . APPENDECTOMY    . CARDIAC SURGERY    . CORONARY ARTERY BYPASS GRAFT    . KNEE SURGERY    . TONSILLECTOMY       Current Meds  Medication Sig  . amLODipine (NORVASC) 5 MG tablet Take 5 mg by mouth daily.  Marland Kitchen levothyroxine (SYNTHROID, LEVOTHROID) 50 MCG tablet Take 50 mcg by mouth daily before breakfast.   . lisinopril (PRINIVIL,ZESTRIL) 20 MG tablet Take 1 tablet by mouth daily.   . nitroGLYCERIN (NITROSTAT) 0.4 MG SL tablet Place 1 tablet (0.4 mg total) under the tongue every 5 (five) minutes as needed for chest pain.  . rosuvastatin (  CRESTOR) 20 MG tablet Take 0.5 tablets by mouth daily.       Family History: The patient's family history includes Cancer in his father and mother.   ROS:   Please see the history of present illness.     All other systems reviewed and are negative.   Labs/Other Tests and Data Reviewed:     Recent Labs: No results found for requested labs within last 8760 hours.  Recent Lipid Panel No results found for: CHOL, TRIG, HDL,  CHOLHDL, VLDL, LDLCALC, LDLDIRECT    Exam:    Vital Signs:  There were no vitals taken for this visit.    Wt Readings from Last 3 Encounters:  11/16/18 225 lb 12.8 oz (102.4 kg)  02/14/17 216 lb (98 kg)     Well nourished, well developed in no acute distress. Alert awake oriented x3 happy to be able to talk to me over the phone we had a long conversation  Diagnosis for this visit:   1. Status post coronary artery bypass graft   2. Coronary artery disease involving native coronary artery of native heart without angina pectoris   3. Dyslipidemia   4. Essential hypertension      ASSESSMENT & PLAN:    1.  Status post coronary bypass graft doing well from that point review will make sure that he will get stress test to assess his functional status as well as help him with exercises.  Medications unchanged and I will continue. 2.  Coronary artery disease plan as outlined above. 3.  Dyslipidemia will contact his primary care physician to get fasting lipid profile. 4.  Essential hypertension that is very well controlled. We spent a great deal of time talking about what he need to do to improve his overall wellbeing.  We talked about avoiding foods with high cholesterol content we talked about Mediterranean diet exercises on a regular basis he promised to start using Fitbit.  COVID-19 Education: The signs and symptoms of COVID-19 were discussed with the patient and how to seek care for testing (follow up with PCP or arrange E-visit).  The importance of social distancing was discussed today.  Patient Risk:   After full review of this patients clinical status, I feel that they are at least moderate risk at this time.  Time:   Today, I have spent 29 minutes with the patient with telehealth technology discussing pt health issues.  I spent 5 minutes reviewing her chart before the visit.  Visit was finished at 1:45 PM.    Medication Adjustments/Labs and Tests Ordered: Current medicines  are reviewed at length with the patient today.  Concerns regarding medicines are outlined above.  Orders Placed This Encounter  Procedures  . ECHOCARDIOGRAM STRESS TEST   Medication changes: No orders of the defined types were placed in this encounter.    Disposition: Follow-up in 3 months  Signed, Georgeanna Leaobert J. , MD, [facility] : [facility] 02/20/2019 1:59 PM    [facility]

## 2019-04-25 ENCOUNTER — Telehealth: Payer: Self-pay | Admitting: Cardiology

## 2019-04-25 DIAGNOSIS — I2510 Atherosclerotic heart disease of native coronary artery without angina pectoris: Secondary | ICD-10-CM

## 2019-04-25 NOTE — Telephone Encounter (Signed)
Will consult with Dr. Agustin Cree about possibly doing lexiscan since we are still not doing stress echos.

## 2019-04-25 NOTE — Telephone Encounter (Signed)
Is scheduled for a stress echo but needs to be changed or scheduled at [place]

## 2019-04-26 NOTE — Addendum Note (Signed)
Addended by: Ashok Norris on: 04/26/2019 04:58 PM   Modules accepted: Orders

## 2019-04-26 NOTE — Telephone Encounter (Signed)
Ok, schedule lexiscan

## 2019-04-27 NOTE — Telephone Encounter (Signed)
Called patient and informed him of lexsican test date and instructions. Patient verbally understands. No further questions.

## 2019-05-02 ENCOUNTER — Telehealth (HOSPITAL_COMMUNITY): Payer: Self-pay

## 2019-05-02 NOTE — Telephone Encounter (Signed)
Spoke with the patient's wife, she stated that she understood his instructions and would give them to him. Asked her to have him call us back with any questions. S.Reade Trefz EMTP

## 2019-05-04 ENCOUNTER — Other Ambulatory Visit: Payer: Self-pay

## 2019-05-04 ENCOUNTER — Ambulatory Visit (HOSPITAL_COMMUNITY): Payer: Medicare HMO | Attending: Cardiology

## 2019-05-04 DIAGNOSIS — I2510 Atherosclerotic heart disease of native coronary artery without angina pectoris: Secondary | ICD-10-CM | POA: Insufficient documentation

## 2019-05-04 LAB — MYOCARDIAL PERFUSION IMAGING
LV dias vol: 116 mL (ref 62–150)
LV sys vol: 52 mL
Peak HR: 81 {beats}/min
Rest HR: 51 {beats}/min
SDS: 3
SRS: 0
SSS: 3
TID: 1.1

## 2019-05-04 MED ORDER — REGADENOSON 0.4 MG/5ML IV SOLN
0.4000 mg | Freq: Once | INTRAVENOUS | Status: AC
  Administered 2019-05-04: 0.4 mg via INTRAVENOUS

## 2019-05-04 MED ORDER — TECHNETIUM TC 99M TETROFOSMIN IV KIT
32.4000 | PACK | Freq: Once | INTRAVENOUS | Status: AC | PRN
  Administered 2019-05-04: 32.4 via INTRAVENOUS
  Filled 2019-05-04: qty 33

## 2019-05-04 MED ORDER — TECHNETIUM TC 99M TETROFOSMIN IV KIT
10.6000 | PACK | Freq: Once | INTRAVENOUS | Status: AC | PRN
  Administered 2019-05-04: 10.6 via INTRAVENOUS
  Filled 2019-05-04: qty 11

## 2019-05-14 ENCOUNTER — Telehealth: Payer: Self-pay | Admitting: Emergency Medicine

## 2019-05-14 NOTE — Telephone Encounter (Signed)
Left message for patient to return call regarding results  

## 2019-05-15 NOTE — Telephone Encounter (Signed)
Patient informed of results.  

## 2019-05-15 NOTE — Telephone Encounter (Signed)
Patient returned call

## 2019-05-16 ENCOUNTER — Other Ambulatory Visit: Payer: Self-pay

## 2019-05-16 ENCOUNTER — Encounter: Payer: Self-pay | Admitting: Cardiology

## 2019-05-16 ENCOUNTER — Telehealth (INDEPENDENT_AMBULATORY_CARE_PROVIDER_SITE_OTHER): Payer: Medicare HMO | Admitting: Cardiology

## 2019-05-16 VITALS — BP 126/73 | Wt 214.0 lb

## 2019-05-16 DIAGNOSIS — Z951 Presence of aortocoronary bypass graft: Secondary | ICD-10-CM

## 2019-05-16 DIAGNOSIS — E785 Hyperlipidemia, unspecified: Secondary | ICD-10-CM

## 2019-05-16 DIAGNOSIS — I2510 Atherosclerotic heart disease of native coronary artery without angina pectoris: Secondary | ICD-10-CM | POA: Diagnosis not present

## 2019-05-16 DIAGNOSIS — I10 Essential (primary) hypertension: Secondary | ICD-10-CM

## 2019-05-16 NOTE — Progress Notes (Signed)
Virtual Visit via Video Note   This visit type was conducted due to national recommendations for restrictions regarding the COVID-19 Pandemic (e.g. social distancing) in an effort to limit this patient's exposure and mitigate transmission in our community.  Due to his co-morbid illnesses, this patient is at least at moderate risk for complications without adequate follow up.  This format is felt to be most appropriate for this patient at this time.  All issues noted in this document were discussed and addressed.  A limited physical exam was performed with this format.  Please refer to the patient's chart for his consent to telehealth for [facility].  Evaluation Performed:  Follow-up visit  This visit type was conducted due to national recommendations for restrictions regarding the COVID-19 Pandemic (e.g. social distancing).  This format is felt to be most appropriate for this patient at this time.  All issues noted in this document were discussed and addressed.  No physical exam was performed (except for noted visual exam findings with Video Visits).  Please refer to the patient's chart (MyChart message for video visits and phone note for telephone visits) for the patient's consent to telehealth for [facility].  Date:  05/16/2019  ID: Justin Johnston, DOB 04/18/1946, MRN [id number]   Patient Location: [address] [place] [id number]   Provider location[facility]   [facility] [place] [facility]  PCP:  Burton Apleyoberts, Ronald, MD  Cardiologist:  Gypsy Balsamobert , MD     Chief Complaint: Doing very well  History of Present Illness:    Justin Johnston is a 73 y.o. male  who presents via audio/video conferencing for a telehealth visit today.  With coronary artery disease status post coronary artery bypass graft many years ago.  Also hypertension as well as dyslipidemia.  He does have a video visit with me today.  Few weeks ago he did have a stress test stress test showed no evidence of  ischemia.  He described the fact that for about 3 days after stress he felt very poorly he described to 5 weakness fatigue but now he is back to himself is working very hard in the garden he kept wound care wound and have no difficulty doing it overall he feels good   The patient does not have symptoms concerning for COVID-19 infection (fever, chills, cough, or new SHORTNESS OF BREATH).    Prior CV studies:   The following studies were reviewed today:  Stress test which showed no evidence of ischemia echocardiogram showed preserved ejection fraction     Past Medical History:  Diagnosis Date  . Arthritis   . Coronary artery disease   . Hypertension   . [facility]   . Thyroid disease     Past Surgical History:  Procedure Laterality Date  . APPENDECTOMY    . CARDIAC SURGERY    . CORONARY ARTERY BYPASS GRAFT    . KNEE SURGERY    . TONSILLECTOMY       Current Meds  Medication Sig  . amLODipine (NORVASC) 5 MG tablet Take 5 mg by mouth daily.  Marland Kitchen. levothyroxine (SYNTHROID, LEVOTHROID) 50 MCG tablet Take 50 mcg by mouth daily before breakfast.   . lisinopril (PRINIVIL,ZESTRIL) 20 MG tablet Take 1 tablet by mouth daily.   . Multiple Vitamins-Minerals (PRESERVISION AREDS 2 PO) Take 1 capsule by mouth daily.  . nitroGLYCERIN (NITROSTAT) 0.4 MG SL tablet Place 1 tablet (0.4 mg total) under the tongue every 5 (five) minutes as needed  for chest pain.  . rosuvastatin (CRESTOR) 20 MG tablet Take 0.5 tablets by mouth daily.       Family History: The patient's family history includes Cancer in his father and mother.   ROS:   Please see the history of present illness.     All other systems reviewed and are negative.   Labs/Other Tests and Data Reviewed:     Recent Labs: No results found for requested labs within last 8760 hours.  Recent Lipid Panel No results found for: CHOL, TRIG, HDL, CHOLHDL, VLDL, LDLCALC, LDLDIRECT    Exam:    Vital Signs:  BP  126/73   Wt 214 lb (97.1 kg)   BMI 34.54 kg/m     Wt Readings from Last 3 Encounters:  05/16/19 214 lb (97.1 kg)  11/16/18 225 lb 12.8 oz (102.4 kg)  02/14/17 216 lb (98 kg)     Well nourished, well developed in no acute distress. Alert awake and attentive 3 talking to me through the video link looking good he is actually in his garden I am in our office in [facility].  Diagnosis for this visit:   1. Coronary artery disease involving native coronary artery of native heart without angina pectoris   2. Essential hypertension   3. Status post coronary artery bypass graft   4. Dyslipidemia      ASSESSMENT & PLAN:    1.  Coronary disease stable on appropriate medications which I will continue 2.  Essential hypertension blood pressure well controlled continue present management. 3.  Status post coronary artery bypass graft stable later stress test negative for exercise-induced myocardial ischemia. 4.  Dyslipidemia he is taking 10 mg Crestor which I will continue he scheduled to see his primary care physician in December to check on his cholesterol will wait for results of it.  COVID-19 Education: The signs and symptoms of COVID-19 were discussed with the patient and how to seek care for testing (follow up with PCP or arrange E-visit).  The importance of social distancing was discussed today.  Patient Risk:   After full review of this patients clinical status, I feel that they are at least moderate risk at this time.  Time:   Today, I have spent 15 minutes with the patient with telehealth technology discussing pt health issues.  I spent 5 minutes reviewing her chart before the visit.  Visit was finished at 1:27 PM.    Medication Adjustments/Labs and Tests Ordered: Current medicines are reviewed at length with the patient today.  Concerns regarding medicines are outlined above.  No orders of the defined types were placed in this encounter.  Medication changes: No orders of the  defined types were placed in this encounter.    Disposition: Follow-up in 5 months  Signed, Park Liter, MD, [facility] 05/16/2019 1:25 PM    [facility]

## 2019-05-16 NOTE — Patient Instructions (Signed)
Medication Instructions:  Your physician recommends that you continue on your current medications as directed. Please refer to the Current Medication list given to you today.  If you need a refill on your cardiac medications before your next appointment, please call your pharmacy.   Lab work: NONE   If you have labs (blood work) drawn today and your tests are completely normal, you will receive your results only by: . MyChart Message (if you have MyChart) OR . A paper copy in the mail If you have any lab test that is abnormal or we need to change your treatment, we will call you to review the results.  Testing/Procedures: NONE  Follow-Up: At [facility], you and your health needs are our priority.  As part of our continuing mission to provide you with exceptional heart care, we have created designated Provider Care Teams.  These Care Teams include your primary Cardiologist (physician) and Advanced Practice Providers (APPs -  Physician Assistants and Nurse Practitioners) who all work together to provide you with the care you need, when you need it. You will need a follow up appointment in 5 months.  Please call our office 2 months in advance to schedule this appointment.  You may see No primary care provider on file. or another member of our [facility] Provider Team in [place]: Brian Munley, MD . Rajan Revankar, MD  Any Other Special Instructions Will Be Listed Below (If Applicable).    

## 2019-10-19 ENCOUNTER — Ambulatory Visit: Payer: Medicare HMO | Attending: Internal Medicine

## 2019-10-19 DIAGNOSIS — Z23 Encounter for immunization: Secondary | ICD-10-CM

## 2019-10-19 NOTE — Progress Notes (Signed)
   Covid-19 Vaccination Clinic  Name:  Deveon Kisiel    MRN: [id number] DOB: [birth year]  10/19/2019  Mr. Springs was observed post Covid-19 immunization for 15 minutes without incidence. He was provided with Vaccine Information Sheet and instruction to access the V-Safe system.   Mr. Walkins was instructed to call 911 with any severe reactions post vaccine: Marland Kitchen Difficulty breathing  . Swelling of your face and throat  . A fast heartbeat  . A bad rash all over your body  . Dizziness and weakness    Immunizations Administered    Name Date Dose VIS Date Route   Pfizer COVID-19 Vaccine 10/19/2019  1:39 PM 0.3 mL 09/07/2019 Intramuscular   Manufacturer: [employer], [employer]   Lot: [id number]   NDC: [address]-8

## 2019-11-09 ENCOUNTER — Ambulatory Visit: Payer: Medicare HMO | Attending: Internal Medicine

## 2019-11-09 DIAGNOSIS — Z23 Encounter for immunization: Secondary | ICD-10-CM | POA: Insufficient documentation

## 2019-11-09 NOTE — Progress Notes (Signed)
   Covid-19 Vaccination Clinic  Name:  Geo Slone    MRN: [id number] DOB: 06/07/1946  11/09/2019  Mr. Wanninger was observed post Covid-19 immunization for 15 minutes without incidence. He was provided with Vaccine Information Sheet and instruction to access the V-Safe system.   Mr. Galindo was instructed to call 911 with any severe reactions post vaccine: Marland Kitchen Difficulty breathing  . Swelling of your face and throat  . A fast heartbeat  . A bad rash all over your body  . Dizziness and weakness    Immunizations Administered    Name Date Dose VIS Date Route   Pfizer COVID-19 Vaccine 11/09/2019 12:22 PM 0.3 mL 09/07/2019 Intramuscular   Manufacturer: [employer], [employer]   Lot: [id number]   NDC: [address]-9

## 2020-03-07 ENCOUNTER — Other Ambulatory Visit: Payer: Self-pay | Admitting: Cardiology

## 2020-12-24 ENCOUNTER — Other Ambulatory Visit: Payer: Self-pay | Admitting: Internal Medicine

## 2020-12-24 ENCOUNTER — Ambulatory Visit
Admission: RE | Admit: 2020-12-24 | Discharge: 2020-12-24 | Disposition: A | Discharge status: Home / Self Care | Payer: Medicare Other | Source: Ambulatory Visit | Attending: Internal Medicine | Admitting: Internal Medicine

## 2020-12-24 ENCOUNTER — Other Ambulatory Visit: Payer: Self-pay | Admitting: *Deleted

## 2020-12-24 DIAGNOSIS — M25532 Pain in left wrist: Secondary | ICD-10-CM

## 2021-11-13 DIAGNOSIS — E079 Disorder of thyroid, unspecified: Secondary | ICD-10-CM | POA: Insufficient documentation

## 2021-11-13 DIAGNOSIS — A770 Spotted fever due to Rickettsia rickettsii: Secondary | ICD-10-CM | POA: Insufficient documentation

## 2021-11-13 DIAGNOSIS — M1990 Unspecified osteoarthritis, unspecified site: Secondary | ICD-10-CM | POA: Insufficient documentation

## 2021-11-17 ENCOUNTER — Encounter: Payer: Self-pay | Admitting: Cardiology

## 2021-11-17 ENCOUNTER — Other Ambulatory Visit: Payer: Self-pay

## 2021-11-17 ENCOUNTER — Ambulatory Visit: Payer: Medicare Other | Admitting: Cardiology

## 2021-11-17 VITALS — BP 150/76 | HR 83 | Ht 66.0 in | Wt 221.0 lb

## 2021-11-17 DIAGNOSIS — Z951 Presence of aortocoronary bypass graft: Secondary | ICD-10-CM | POA: Diagnosis not present

## 2021-11-17 DIAGNOSIS — M1990 Unspecified osteoarthritis, unspecified site: Secondary | ICD-10-CM | POA: Diagnosis not present

## 2021-11-17 DIAGNOSIS — I10 Essential (primary) hypertension: Secondary | ICD-10-CM | POA: Diagnosis not present

## 2021-11-17 DIAGNOSIS — R079 Chest pain, unspecified: Secondary | ICD-10-CM

## 2021-11-17 DIAGNOSIS — I2510 Atherosclerotic heart disease of native coronary artery without angina pectoris: Secondary | ICD-10-CM | POA: Diagnosis not present

## 2021-11-17 MED ORDER — ASPIRIN EC 81 MG PO TBEC: 81.0000 mg | DELAYED_RELEASE_TABLET | Freq: Every day | ORAL | 3 refills | Status: DC

## 2021-11-17 NOTE — Progress Notes (Signed)
Cardiology Office Note:    Date:  11/17/2021   ID:  Justin Johnston, DOB 28-May-1946, MRN [id number]  PCP:  Burton Apley, MD  Cardiologist:  Gypsy Balsam, MD    Referring MD: Burton Apley, MD   No chief complaint on file. I would like to start aggressive exercise program  History of Present Illness:    Justin Johnston is a 76 y.o. male with past medical history significant for coronary artery disease, status post coronary artery bypass graft done in 2002.  Last evaluation of coronary artery disease was done in 2019 when he had stress test done which was negative.  Also essential hypertension, dyslipidemia.  He comes today to my office for follow-up.  Overall he is doing very well but he is complaining of modifying that he is not exercising much he is insisting on starting aggressive exercise program which I think is a good idea about he want to be checked make sure it safe for him to do this he described to have some fatigue tiredness and shortness of breath but no chest pain tightness squeezing pressure burning chest.  Past Medical History:  Diagnosis Date   Arthritis    Coronary artery disease    Hypertension    [facility] spotted fever    Thyroid disease     Past Surgical History:  Procedure Laterality Date   APPENDECTOMY     CARDIAC SURGERY     CORONARY ARTERY BYPASS GRAFT     KNEE SURGERY     TONSILLECTOMY      Current Medications: Current Meds  Medication Sig   amLODipine (NORVASC) 5 MG tablet Take 5 mg by mouth daily.   ketoconazole (NIZORAL) 2 % cream Apply topically 2 (two) times daily.   levothyroxine (SYNTHROID, LEVOTHROID) 50 MCG tablet Take 50 mcg by mouth daily before breakfast.    lisinopril (PRINIVIL,ZESTRIL) 20 MG tablet Take 1 tablet by mouth daily.    Multiple Vitamins-Minerals (PRESERVISION AREDS 2 PO) Take 2 capsules by mouth daily.   niacinamide 500 MG tablet Take 500 mg by mouth 2 (two) times daily with a meal.   nitroGLYCERIN (NITROSTAT)  0.4 MG SL tablet PLACE 1 TABLET UNDER THE TONGUE EVERY 5 MINUTES AS NEEDED FOR CHEST PAIN   rosuvastatin (CRESTOR) 40 MG tablet Take 40 mg by mouth daily.   Testosterone 20 % CREA Apply 1 mL topically daily.   traMADol (ULTRAM) 50 MG tablet Take 50 mg by mouth 2 (two) times daily.     Allergies:   Patient has no known allergies.   Social History   Socioeconomic History   Marital status: Single    Spouse name: Not on file   Number of children: Not on file   Years of education: Not on file   Highest education level: Not on file  Occupational History   Not on file  Tobacco Use   Smoking status: Former    Passive exposure: Past   Smokeless tobacco: Never  Vaping Use   Vaping Use: Never used  Substance and Sexual Activity   Alcohol use: Yes   Drug use: No   Sexual activity: Not on file  Other Topics Concern   Not on file  Social History Narrative   Not on file   Social Determinants of Health   Financial Resource Strain: Not on file  Food Insecurity: Not on file  Transportation Needs: Not on file  Physical Activity: Not on file  Stress: Not on file  Social Connections: Not on  file     Family History: The patient's family history includes Cancer in his father and mother. ROS:   Please see the history of present illness.    All 14 point review of systems negative except as described per history of present illness  EKGs/Labs/Other Studies Reviewed:      Recent Labs: No results found for requested labs within last 8760 hours.  Recent Lipid Panel No results found for: CHOL, TRIG, HDL, CHOLHDL, VLDL, LDLCALC, LDLDIRECT  Physical Exam:    VS:  BP (!) 150/76 (BP Location: Right Arm)    Pulse 83    Ht 5\' 6"  (1.676 m)    Wt 221 lb (100.2 kg)    SpO2 97%    BMI 35.67 kg/m     Wt Readings from Last 3 Encounters:  11/17/21 221 lb (100.2 kg)  05/16/19 214 lb (97.1 kg)  11/16/18 225 lb 12.8 oz (102.4 kg)     GEN:  Well nourished, well developed in no acute  distress HEENT: Normal NECK: No JVD; No carotid bruits LYMPHATICS: No lymphadenopathy CARDIAC: RRR, no murmurs, no rubs, no gallops RESPIRATORY:  Clear to auscultation without rales, wheezing or rhonchi  ABDOMEN: Soft, non-tender, non-distended MUSCULOSKELETAL:  No edema; No deformity  SKIN: Warm and dry LOWER EXTREMITIES: no swelling NEUROLOGIC:  Alert and oriented x 3 PSYCHIATRIC:  Normal affect   ASSESSMENT:    1. Status post coronary artery bypass graft   2. Essential hypertension   3. Coronary artery disease involving native coronary artery of native heart without angina pectoris   4. Arthritis    PLAN:    In order of problems listed above:  Coronary artery disease status post coronary bypass graft many years ago.  He would like to start aggressive exercise program therefore I think we need to rule out possibility of reactivation of coronary artery disease.  I will ask him to have a stress test.  We will do exercise Cardiolite.  We will also schedule to have echocardiogram to assess left ventricle ejection fraction.  After reviewing all his medication I noticed that he is not taking aspirin asked him to stop taking 1 baby aspirin every single day and explained reason for him to take that. Dyslipidemia he brought his fasting lipid profile to be done by primary care physician.  His LDL was 112 still elevated however he is rosuvastatin has been.  Just did this week.  Will within the next few weeks. Essential hypertension blood pressure slightly elevated but this is first visit after long time we will continue monitoring. Arthritis.  Not   Medication Adjustments/Labs and Tests Ordered: Current medicines are reviewed at length with the patient today.  Concerns regarding medicines are outlined above.  No orders of the defined types were placed in this encounter.  Medication changes: No orders of the defined types were placed in this encounter.   Signed, 11/18/18, MD,  [facility] 11/17/2021 4:48 PM    [facility]

## 2021-11-17 NOTE — Patient Instructions (Signed)
Medication Instructions:  Your physician has recommended you make the following change in your medication:   START: Aspirin 81 mg daily  *If you need a refill on your cardiac medications before your next appointment, please call your pharmacy*   Lab Work: None If you have labs (blood work) drawn today and your tests are completely normal, you will receive your results only by: MyChart Message (if you have MyChart) OR A paper copy in the mail If you have any lab test that is abnormal or we need to change your treatment, we will call you to review the results.   Testing/Procedures: Your physician has requested that you have an echocardiogram. Echocardiography is a painless test that uses sound waves to create images of your heart. It provides your doctor with information about the size and shape of your heart and how well your hearts chambers and valves are working. This procedure takes approximately one hour. There are no restrictions for this procedure.   Your physician has requested that you have en exercise stress myoview. For further information please visit [email or web address] Please follow instruction sheet, as given.    Follow-Up: At [facility], you and your health needs are our priority.  As part of our continuing mission to provide you with exceptional heart care, we have created designated Provider Care Teams.  These Care Teams include your primary Cardiologist (physician) and Advanced Practice Providers (APPs -  Physician Assistants and Nurse Practitioners) who all work together to provide you with the care you need, when you need it.  We recommend signing up for the patient portal called "MyChart".  Sign up information is provided on this After Visit Summary.  MyChart is used to connect with patients for Virtual Visits (Telemedicine).  Patients are able to view lab/test results, encounter notes, upcoming appointments, etc.  Non-urgent messages can be sent to your provider as  well.   To learn more about what you can do with MyChart, go to [email or web address].    Your next appointment:   3 month(s)  The format for your next appointment:   In Person  Provider:   Gypsy Balsam, MD    Other Instructions None

## 2021-11-18 ENCOUNTER — Other Ambulatory Visit: Payer: Self-pay

## 2021-11-18 DIAGNOSIS — R079 Chest pain, unspecified: Secondary | ICD-10-CM

## 2021-11-18 NOTE — Progress Notes (Signed)
f °

## 2021-11-24 ENCOUNTER — Telehealth (HOSPITAL_COMMUNITY): Payer: Self-pay

## 2021-11-24 NOTE — Telephone Encounter (Signed)
Detailed instructions left on the patient's answering machine. Asked to call back wit any questions. S.Jimya Ciani EMTP 

## 2021-11-26 ENCOUNTER — Other Ambulatory Visit: Payer: Self-pay

## 2021-11-26 ENCOUNTER — Ambulatory Visit (HOSPITAL_COMMUNITY): Payer: Medicare Other | Attending: Cardiology

## 2021-11-26 ENCOUNTER — Ambulatory Visit (HOSPITAL_BASED_OUTPATIENT_CLINIC_OR_DEPARTMENT_OTHER): Payer: Medicare Other

## 2021-11-26 DIAGNOSIS — Z951 Presence of aortocoronary bypass graft: Secondary | ICD-10-CM | POA: Insufficient documentation

## 2021-11-26 DIAGNOSIS — M1990 Unspecified osteoarthritis, unspecified site: Secondary | ICD-10-CM | POA: Diagnosis not present

## 2021-11-26 DIAGNOSIS — I2510 Atherosclerotic heart disease of native coronary artery without angina pectoris: Secondary | ICD-10-CM

## 2021-11-26 DIAGNOSIS — I10 Essential (primary) hypertension: Secondary | ICD-10-CM | POA: Diagnosis not present

## 2021-11-26 DIAGNOSIS — R079 Chest pain, unspecified: Secondary | ICD-10-CM | POA: Insufficient documentation

## 2021-11-26 LAB — MYOCARDIAL PERFUSION IMAGING
Estimated workload: 7
Exercise duration (min): 6 min
Exercise duration (sec): 2 s
LV dias vol: 113 mL (ref 62–150)
LV sys vol: 55 mL
MPHR: 145 {beats}/min
Nuc Stress EF: 51 %
Peak HR: 127 {beats}/min
Percent HR: 87 %
RPE: 19
Rest HR: 56 {beats}/min
Rest Nuclear Isotope Dose: 10.1 mCi
SDS: 3
SRS: 0
SSS: 3
Stress Nuclear Isotope Dose: 30.3 mCi
TID: 1.02

## 2021-11-26 LAB — ECHOCARDIOGRAM COMPLETE
Area-P 1/2: 2.87 cm2
S' Lateral: 3.4 cm

## 2021-11-26 MED ORDER — TECHNETIUM TC 99M TETROFOSMIN IV KIT
30.3000 | PACK | Freq: Once | INTRAVENOUS | Status: AC | PRN
  Administered 2021-11-26: 30.3 via INTRAVENOUS
  Filled 2021-11-26: qty 31

## 2021-11-26 MED ORDER — TECHNETIUM TC 99M TETROFOSMIN IV KIT
10.1000 | PACK | Freq: Once | INTRAVENOUS | Status: AC | PRN
  Administered 2021-11-26: 10.1 via INTRAVENOUS
  Filled 2021-11-26: qty 11

## 2021-11-26 MED ORDER — PERFLUTREN LIPID MICROSPHERE
1.0000 mL | INTRAVENOUS | Status: AC | PRN
  Administered 2021-11-26: 1 mL via INTRAVENOUS

## 2021-11-27 ENCOUNTER — Other Ambulatory Visit: Payer: Self-pay

## 2021-11-30 ENCOUNTER — Telehealth: Payer: Self-pay

## 2021-11-30 ENCOUNTER — Other Ambulatory Visit: Payer: Self-pay

## 2021-11-30 MED ORDER — NITROGLYCERIN 0.4 MG SL SUBL: SUBLINGUAL_TABLET | SUBLINGUAL | 1 refills | Status: DC

## 2021-11-30 NOTE — Telephone Encounter (Signed)
Attempted to call patient to inform him of Dr. Wendy Poet recommendation for starting to work out again. Patient did not answer. Left message for patient to call back. ?

## 2021-11-30 NOTE — Telephone Encounter (Signed)
-----   Message from Park Liter, MD sent at 11/30/2021  8:41 AM EST ----- ?Test were good, he can start exercising and pushing himself, just be reasonable  ? ?----- Message ----- ?From: Louie Casa, RN ?Sent: 11/27/2021   4:04 PM EST ?To: Park Liter, MD ? ?While informing the patient of his echocardiogram results he had a question about how aggressive he can be with his new workout regimen, that he is starting, to lose weight. The reason he is having these test done is to make sure his heart will be ok. He is also concerned because he has a history of bypass surgery. Please advise. ? ? ?

## 2021-11-30 NOTE — Telephone Encounter (Signed)
-----   Message from Robert J Krasowski, MD sent at 11/30/2021  8:41 AM EST ----- ?Test were good, he can start exercising and pushing himself, just be reasonable  ? ?----- Message ----- ?From: Pilot Prindle I, RN ?Sent: 11/27/2021   4:04 PM EST ?To: Robert J Krasowski, MD ? ?While informing the patient of his echocardiogram results he had a question about how aggressive he can be with his new workout regimen, that he is starting, to lose weight. The reason he is having these test done is to make sure his heart will be ok. He is also concerned because he has a history of bypass surgery. Please advise. ? ? ?

## 2021-11-30 NOTE — Telephone Encounter (Signed)
Patient informed of Dr. Vanetta Shawl recommendation and had no further questions at this time. ?

## 2022-02-16 ENCOUNTER — Ambulatory Visit: Payer: Medicare Other | Admitting: Cardiology

## 2022-02-16 ENCOUNTER — Encounter: Payer: Self-pay | Admitting: Cardiology

## 2022-02-16 VITALS — BP 132/86 | HR 67 | Ht 66.0 in | Wt 219.0 lb

## 2022-02-16 DIAGNOSIS — Z951 Presence of aortocoronary bypass graft: Secondary | ICD-10-CM

## 2022-02-16 DIAGNOSIS — I10 Essential (primary) hypertension: Secondary | ICD-10-CM

## 2022-02-16 DIAGNOSIS — I2510 Atherosclerotic heart disease of native coronary artery without angina pectoris: Secondary | ICD-10-CM | POA: Diagnosis not present

## 2022-02-16 DIAGNOSIS — E785 Hyperlipidemia, unspecified: Secondary | ICD-10-CM

## 2022-02-16 NOTE — Patient Instructions (Signed)

## 2022-02-16 NOTE — Progress Notes (Signed)
Cardiology Office Note:    Date:  02/16/2022   ID:  Justin Johnston, DOB [birth year], MRN [id number]  PCP:  Lorene Dy, MD  Cardiologist:  Jenne Campus, MD    Referring MD: Lorene Dy, MD   Chief Complaint  Patient presents with   Follow-up    History of Present Illness:    Justin Johnston is a 76 y.o. male past medical history significant for coronary artery disease, status post coronary bypass graft done in 2002 at that time he got chest tightness and indigestion, last evaluation of coronary artery disease was done in this year earlier when he had a stress test done that stress test was negative for exercise-induced myocardial ischemia.   He comes today to my office concerned about some chest sensation he has he pinpoint to the left side of his sternum he said it pinches like lasting on floor split-second to maybe few seconds not related to exercises.  Since I seen him last time he started exercising on the regular basis but eventually about 4 days ago he ended up having injury to his back and he could not exercise now.  But even when he got the sensation in the chest he was able to exercise he did not have any chest pain caused by those exercises.  Past Medical History:  Diagnosis Date   Arthritis    Coronary artery disease    Hypertension    [place] spotted fever    Thyroid disease     Past Surgical History:  Procedure Laterality Date   APPENDECTOMY     CARDIAC SURGERY     CORONARY ARTERY BYPASS GRAFT     KNEE SURGERY     TONSILLECTOMY      Current Medications: Current Meds  Medication Sig   amLODipine (NORVASC) 5 MG tablet Take 5 mg by mouth daily.   aspirin EC 81 MG tablet Take 1 tablet (81 mg total) by mouth daily. Swallow whole.   HYDROcodone-acetaminophen (NORCO/VICODIN) 5-325 MG tablet Take 1 tablet by mouth 2 (two) times daily.   ketoconazole (NIZORAL) 2 % cream Apply 1 application. topically 2 (two) times daily.   levothyroxine (SYNTHROID,  LEVOTHROID) 50 MCG tablet Take 50 mcg by mouth daily before breakfast.    lisinopril (PRINIVIL,ZESTRIL) 20 MG tablet Take 1 tablet by mouth daily.    Multiple Vitamins-Minerals (PRESERVISION AREDS 2 PO) Take 2 capsules by mouth daily.   niacinamide 500 MG tablet Take 500 mg by mouth 2 (two) times daily with a meal.   nitroGLYCERIN (NITROSTAT) 0.4 MG SL tablet PLACE 1 TABLET UNDER THE TONGUE EVERY 5 MINUTES AS NEEDED FOR CHEST PAIN (Patient taking differently: Place 0.4 mg under the tongue every 5 (five) minutes as needed. PLACE 1 TABLET UNDER THE TONGUE EVERY 5 MINUTES AS NEEDED FOR CHEST PAIN)   rosuvastatin (CRESTOR) 40 MG tablet Take 40 mg by mouth daily.   [DISCONTINUED] traMADol (ULTRAM) 50 MG tablet Take 50 mg by mouth 2 (two) times daily.     Allergies:   Patient has no known allergies.   Social History   Socioeconomic History   Marital status: Single    Spouse name: Not on file   Number of children: Not on file   Years of education: Not on file   Highest education level: Not on file  Occupational History   Not on file  Tobacco Use   Smoking status: Former    Passive exposure: Past   Smokeless tobacco: Never  Vaping Use  Vaping Use: Never used  Substance and Sexual Activity   Alcohol use: Yes   Drug use: No   Sexual activity: Not on file  Other Topics Concern   Not on file  Social History Narrative   Not on file   Social Determinants of Health   Financial Resource Strain: Not on file  Food Insecurity: Not on file  Transportation Needs: Not on file  Physical Activity: Not on file  Stress: Not on file  Social Connections: Not on file     Family History: The patient's family history includes Cancer in his father and mother. ROS:   Please see the history of present illness.    All 14 point review of systems negative except as described per history of present illness  EKGs/Labs/Other Studies Reviewed:      Recent Labs: No results found for requested labs  within last 8760 hours.  Recent Lipid Panel No results found for: CHOL, TRIG, HDL, CHOLHDL, VLDL, LDLCALC, LDLDIRECT  Physical Exam:    VS:  BP 132/86 (BP Location: Left Arm, Patient Position: Sitting)   Pulse 67   Ht 5\' 6"  (1.676 m)   Wt 219 lb (99.3 kg)   SpO2 93%   BMI 35.35 kg/m     Wt Readings from Last 3 Encounters:  02/16/22 219 lb (99.3 kg)  11/17/21 221 lb (100.2 kg)  05/16/19 214 lb (97.1 kg)     GEN:  Well nourished, well developed in no acute distress HEENT: Normal NECK: No JVD; No carotid bruits LYMPHATICS: No lymphadenopathy CARDIAC: RRR, no murmurs, no rubs, no gallops RESPIRATORY:  Clear to auscultation without rales, wheezing or rhonchi  ABDOMEN: Soft, non-tender, non-distended MUSCULOSKELETAL:  No edema; No deformity  SKIN: Warm and dry LOWER EXTREMITIES: no swelling NEUROLOGIC:  Alert and oriented x 3 PSYCHIATRIC:  Normal affect   ASSESSMENT:    1. Status post coronary artery bypass graft   2. Coronary artery disease involving native coronary artery of native heart without angina pectoris   3. Essential hypertension   4. Dyslipidemia    PLAN:    In order of problems listed above:  Coronary artery disease status post coronary bypass graft, stress test negative.  We will continue present management which include antiplatelets therapy. Atypical chest pain not related to exercise lasting only for few seconds. Essential hypertension blood pressure well controlled continue present management Dyslipidemia apparently his primary care physician recently increased dose of statin, will call primary care physician to get a copy of fasting lipid profile   Medication Adjustments/Labs and Tests Ordered: Current medicines are reviewed at length with the patient today.  Concerns regarding medicines are outlined above.  No orders of the defined types were placed in this encounter.  Medication changes: No orders of the defined types were placed in this  encounter.   Signed, Park Liter, MD, [facility] 02/16/2022 3:48 PM    [place]

## 2022-02-18 ENCOUNTER — Ambulatory Visit: Payer: Medicare Other | Admitting: Cardiology

## 2022-06-02 ENCOUNTER — Ambulatory Visit: Payer: Medicare Other | Attending: Cardiology | Admitting: Cardiology

## 2022-06-02 VITALS — BP 110/76 | HR 69 | Ht 66.0 in | Wt 216.0 lb

## 2022-06-02 DIAGNOSIS — Z951 Presence of aortocoronary bypass graft: Secondary | ICD-10-CM | POA: Diagnosis not present

## 2022-06-02 DIAGNOSIS — I2510 Atherosclerotic heart disease of native coronary artery without angina pectoris: Secondary | ICD-10-CM

## 2022-06-02 DIAGNOSIS — E785 Hyperlipidemia, unspecified: Secondary | ICD-10-CM | POA: Diagnosis not present

## 2022-06-02 DIAGNOSIS — I10 Essential (primary) hypertension: Secondary | ICD-10-CM

## 2022-06-02 MED ORDER — NITROGLYCERIN 0.4 MG SL SUBL: 0.4000 mg | SUBLINGUAL_TABLET | SUBLINGUAL | 6 refills | Status: AC | PRN

## 2022-06-02 NOTE — Progress Notes (Signed)
Cardiology Office Note:    Date:  06/02/2022   ID:  Justin Johnston, DOB 1946/09/22, MRN [id number]  PCP:  Burton Apley, MD  Cardiologist:  Gypsy Balsam, MD    Referring MD: Burton Apley, MD     History of Present Illness:    Justin Johnston is a 76 y.o. male with past medical history significant for coronary artery disease, status post coronary artery bypass graft done in 2002, last evaluation of coronary artery was done in 2019 he had a stress test done which was negative.  Additional issues include essential hypertension, dyslipidemia.  He is in my office today for follow-up.  Overall he is doing very well last time I spoke to him he got some twinges in the chest but those does not happen anymore additional issues  He did suffer from some back injury he went to physical therapy improved significantly and doing well right now started exercising on the regular basis  Past Medical History:  Diagnosis Date   Arthritis    Coronary artery disease    Hypertension    [facility] spotted fever    Thyroid disease     Past Surgical History:  Procedure Laterality Date   APPENDECTOMY     CARDIAC SURGERY     CORONARY ARTERY BYPASS GRAFT     KNEE SURGERY     TONSILLECTOMY      Current Medications: Current Meds  Medication Sig   amLODipine (NORVASC) 5 MG tablet Take 5 mg by mouth daily.   aspirin EC 81 MG tablet Take 1 tablet (81 mg total) by mouth daily. Swallow whole.   levothyroxine (SYNTHROID, LEVOTHROID) 50 MCG tablet Take 50 mcg by mouth daily before breakfast.    lisinopril (PRINIVIL,ZESTRIL) 20 MG tablet Take 1 tablet by mouth daily.    Multiple Vitamins-Minerals (PRESERVISION AREDS 2 PO) Take 2 capsules by mouth daily.   niacinamide 500 MG tablet Take 500 mg by mouth 2 (two) times daily with a meal.   rosuvastatin (CRESTOR) 40 MG tablet Take 40 mg by mouth daily.   Testosterone 20 % CREA Apply 1 mL topically daily.   [DISCONTINUED] HYDROcodone-acetaminophen  (NORCO/VICODIN) 5-325 MG tablet Take 1 tablet by mouth 2 (two) times daily.   [DISCONTINUED] ketoconazole (NIZORAL) 2 % cream Apply 1 application. topically 2 (two) times daily.   [DISCONTINUED] nitroGLYCERIN (NITROSTAT) 0.4 MG SL tablet PLACE 1 TABLET UNDER THE TONGUE EVERY 5 MINUTES AS NEEDED FOR CHEST PAIN (Patient taking differently: Place 0.4 mg under the tongue every 5 (five) minutes as needed for chest pain. PLACE 1 TABLET UNDER THE TONGUE EVERY 5 MINUTES AS NEEDED FOR CHEST PAIN)   [DISCONTINUED] predniSONE (DELTASONE) 20 MG tablet Take 20 mg by mouth 2 (two) times daily.     Allergies:   Patient has no known allergies.   Social History   Socioeconomic History   Marital status: Single    Spouse name: Not on file   Number of children: Not on file   Years of education: Not on file   Highest education level: Not on file  Occupational History   Not on file  Tobacco Use   Smoking status: Former    Passive exposure: Past   Smokeless tobacco: Never  Vaping Use   Vaping Use: Never used  Substance and Sexual Activity   Alcohol use: Yes   Drug use: No   Sexual activity: Not on file  Other Topics Concern   Not on file  Social History Narrative  Not on file   Social Determinants of Health   Financial Resource Strain: Not on file  Food Insecurity: Not on file  Transportation Needs: Not on file  Physical Activity: Not on file  Stress: Not on file  Social Connections: Not on file     Family History: The patient's family history includes Cancer in his father and mother. ROS:   Please see the history of present illness.    All 14 point review of systems negative except as described per history of present illness  EKGs/Labs/Other Studies Reviewed:      Recent Labs: No results found for requested labs within last 365 days.  Recent Lipid Panel No results found for: "CHOL", "TRIG", "HDL", "CHOLHDL", "VLDL", "LDLCALC", "LDLDIRECT"  Physical Exam:    VS:  BP 110/76 (BP  Location: Left Arm, Patient Position: Sitting)   Pulse 69   Ht 5\' 6"  (1.676 m)   Wt 216 lb (98 kg)   SpO2 96%   BMI 34.86 kg/m     Wt Readings from Last 3 Encounters:  06/02/22 216 lb (98 kg)  02/16/22 219 lb (99.3 kg)  11/17/21 221 lb (100.2 kg)     GEN:  Well nourished, well developed in no acute distress HEENT: Normal NECK: No JVD; No carotid bruits LYMPHATICS: No lymphadenopathy CARDIAC: RRR, no murmurs, no rubs, no gallops RESPIRATORY:  Clear to auscultation without rales, wheezing or rhonchi  ABDOMEN: Soft, non-tender, non-distended MUSCULOSKELETAL:  No edema; No deformity  SKIN: Warm and dry LOWER EXTREMITIES: no swelling NEUROLOGIC:  Alert and oriented x 3 PSYCHIATRIC:  Normal affect   ASSESSMENT:    1. Coronary artery disease involving native coronary artery of native heart without angina pectoris   2. Essential hypertension   3. Status post coronary artery bypass graft    PLAN:    In order of problems listed above:  Coronary disease stable from that point review on appropriate medication which include antiplatelets therapy Essential hypertension: Blood pressure well controlled continue present management. Status post coronary bypass graft.  Noted Dyslipidemia I do not have his fasting lipid profile but he said that he saw his physician in January at that time his Crestor has been increased to 40 mg daily since that time no follow-up I will ask him to have fasting lipid profile done today   Medication Adjustments/Labs and Tests Ordered: Current medicines are reviewed at length with the patient today.  Concerns regarding medicines are outlined above.  No orders of the defined types were placed in this encounter.  Medication changes: No orders of the defined types were placed in this encounter.   Signed, February, MD, [facility] 06/02/2022 1:30 PM    [facility]

## 2022-06-02 NOTE — Patient Instructions (Addendum)
Medication Instructions:  Your physician has recommended you make the following change in your medication: Nitroglycerin Use nitroglycerin 1 tablet placed under the tongue at the first sign of chest pain or an angina attack. 1 tablet may be used every 5 minutes as needed, for up to 15 minutes. Do not take more than 3 tablets in 15 minutes. If pain persist call 911 or go to the nearest ED.       Lab Work: Lipid Panel- Today 2nd Floor [address] If you have labs (blood work) drawn today and your tests are completely normal, you will receive your results only by: MyChart Message (if you have MyChart) OR A paper copy in the mail If you have any lab test that is abnormal or we need to change your treatment, we will call you to review the results.   Testing/Procedures: None Ordered   Follow-Up: At [facility], you and your health needs are our priority.  As part of our continuing mission to provide you with exceptional heart care, we have created designated Provider Care Teams.  These Care Teams include your primary Cardiologist (physician) and Advanced Practice Providers (APPs -  Physician Assistants and Nurse Practitioners) who all work together to provide you with the care you need, when you need it.  We recommend signing up for the patient portal called "MyChart".  Sign up information is provided on this After Visit Summary.  MyChart is used to connect with patients for Virtual Visits (Telemedicine).  Patients are able to view lab/test results, encounter notes, upcoming appointments, etc.  Non-urgent messages can be sent to your provider as well.   To learn more about what you can do with MyChart, go to [email or web address].    Your next appointment:   6 month(s)  The format for your next appointment:   In Person  Provider:   Gypsy Balsam, MD    Other Instructions NA

## 2022-06-03 LAB — LIPID PANEL
Chol/HDL Ratio: 3.7 ratio (ref 0.0–5.0)
Cholesterol, Total: 157 mg/dL (ref 100–199)
HDL: 42 mg/dL (ref >39)
LDL Chol Calc (NIH): 94 mg/dL (ref 0–99)
Triglycerides: 116 mg/dL (ref 0–149)
VLDL Cholesterol Cal: 21 mg/dL (ref 5–40)

## 2022-06-10 ENCOUNTER — Telehealth: Payer: Self-pay

## 2022-06-10 DIAGNOSIS — E785 Hyperlipidemia, unspecified: Secondary | ICD-10-CM

## 2022-06-10 MED ORDER — EZETIMIBE 10 MG PO TABS: 10.0000 mg | ORAL_TABLET | Freq: Every day | ORAL | 0 refills | Status: DC

## 2022-06-10 NOTE — Telephone Encounter (Signed)
-----   Message from Georgeanna Lea, MD sent at 06/09/2022 11:15 AM EDT ----- Cholesterol still not perfect please add Zetia 10 to everything else he takes, fasting lipid profile, AST LT 6 weeks

## 2022-06-10 NOTE — Telephone Encounter (Signed)
Patient notified of results and recommendations and agreed to plan. Rx sent to confirmed pharmacy and he aware to fast for his blood work. Order on file

## 2022-09-16 ENCOUNTER — Other Ambulatory Visit: Payer: Self-pay | Admitting: Cardiology

## 2022-12-01 ENCOUNTER — Ambulatory Visit: Payer: Medicare Other | Admitting: Cardiology

## 2023-01-31 ENCOUNTER — Ambulatory Visit: Payer: Medicare Other | Attending: Cardiology | Admitting: Cardiology

## 2023-01-31 ENCOUNTER — Encounter: Payer: Self-pay | Admitting: Cardiology

## 2023-01-31 VITALS — BP 136/76 | HR 80 | Ht 66.0 in | Wt 214.0 lb

## 2023-01-31 DIAGNOSIS — Z951 Presence of aortocoronary bypass graft: Secondary | ICD-10-CM

## 2023-01-31 DIAGNOSIS — I2510 Atherosclerotic heart disease of native coronary artery without angina pectoris: Secondary | ICD-10-CM

## 2023-01-31 DIAGNOSIS — R0609 Other forms of dyspnea: Secondary | ICD-10-CM

## 2023-01-31 DIAGNOSIS — E079 Disorder of thyroid, unspecified: Secondary | ICD-10-CM

## 2023-01-31 DIAGNOSIS — E785 Hyperlipidemia, unspecified: Secondary | ICD-10-CM

## 2023-01-31 DIAGNOSIS — I10 Essential (primary) hypertension: Secondary | ICD-10-CM

## 2023-01-31 NOTE — Progress Notes (Signed)
Cardiology Office Note:    Date:  01/31/2023   ID:  Justin Johnston, DOB 1945-12-13, MRN [id number]  PCP:  Burton Apley, MD  Cardiologist:  Gypsy Balsam, MD    Referring MD: Burton Apley, MD   Chief Complaint  Patient presents with   Follow-up  Doing fine  History of Present Illness:    Justin Johnston is a 77 y.o. male with past medical history significant for coronary artery disease, status post coronary bypass graft done in 2012, last evaluation of his coronary artery was done in December 2023 in from stress test which was negative.  Additional issues include essential hypertension dyslipidemia.  Comes today to months for follow-up he is planning to go with his oldest daughter to [place] and he is looking forward to it.  He is trying to get in better shape trying to walk on the regular basis but described to have tired fatigue and exhausted.  Denies have any chest pain tightness squeezing pressure burning chest.  Past Medical History:  Diagnosis Date   Arthritis    Coronary artery disease    Hypertension    [facility] spotted fever    Thyroid disease     Past Surgical History:  Procedure Laterality Date   APPENDECTOMY     CARDIAC SURGERY     CORONARY ARTERY BYPASS GRAFT     KNEE SURGERY     TONSILLECTOMY      Current Medications: Current Meds  Medication Sig   amLODipine (NORVASC) 5 MG tablet Take 5 mg by mouth daily.   aspirin EC 81 MG tablet Take 1 tablet (81 mg total) by mouth daily. Swallow whole.   ezetimibe (ZETIA) 10 MG tablet Take 1 tablet (10 mg total) by mouth daily.   levothyroxine (SYNTHROID, LEVOTHROID) 50 MCG tablet Take 50 mcg by mouth daily before breakfast.    lisinopril (PRINIVIL,ZESTRIL) 20 MG tablet Take 1 tablet by mouth daily.    Multiple Vitamins-Minerals (PRESERVISION AREDS 2 PO) Take 2 capsules by mouth daily.   rosuvastatin (CRESTOR) 40 MG tablet Take 40 mg by mouth daily.   Testosterone 20 % CREA Apply 1 mL topically daily.      Allergies:   Patient has no known allergies.   Social History   Socioeconomic History   Marital status: Single    Spouse name: Not on file   Number of children: Not on file   Years of education: Not on file   Highest education level: Not on file  Occupational History   Not on file  Tobacco Use   Smoking status: Former    Passive exposure: Past   Smokeless tobacco: Never  Vaping Use   Vaping Use: Never used  Substance and Sexual Activity   Alcohol use: Yes   Drug use: No   Sexual activity: Not on file  Other Topics Concern   Not on file  Social History Narrative   Not on file   Social Determinants of Health   Financial Resource Strain: Not on file  Food Insecurity: Not on file  Transportation Needs: Not on file  Physical Activity: Not on file  Stress: Not on file  Social Connections: Not on file     Family History: The patient's family history includes Cancer in his father and mother. ROS:   Please see the history of present illness.    All 14 point review of systems negative except as described per history of present illness  EKGs/Labs/Other Studies Reviewed:  Recent Labs: No results found for requested labs within last 365 days.  Recent Lipid Panel    Component Value Date/Time   CHOL 157 06/02/2022 1348   TRIG 116 06/02/2022 1348   HDL 42 06/02/2022 1348   CHOLHDL 3.7 06/02/2022 1348   LDLCALC 94 06/02/2022 1348    Physical Exam:    VS:  BP 136/76 (BP Location: Left Arm, Patient Position: Sitting)   Pulse 80   Ht 5\' 6"  (1.676 m)   Wt 214 lb (97.1 kg)   SpO2 93%   BMI 34.54 kg/m     Wt Readings from Last 3 Encounters:  01/31/23 214 lb (97.1 kg)  06/02/22 216 lb (98 kg)  02/16/22 219 lb (99.3 kg)     GEN:  Well nourished, well developed in no acute distress HEENT: Normal NECK: No JVD; No carotid bruits LYMPHATICS: No lymphadenopathy CARDIAC: RRR, no murmurs, no rubs, no gallops RESPIRATORY:  Clear to auscultation without rales,  wheezing or rhonchi  ABDOMEN: Soft, non-tender, non-distended MUSCULOSKELETAL:  No edema; No deformity  SKIN: Warm and dry LOWER EXTREMITIES: no swelling NEUROLOGIC:  Alert and oriented x 3 PSYCHIATRIC:  Normal affect   ASSESSMENT:    1. Status post coronary artery bypass graft   2. Dyslipidemia   3. Essential hypertension   4. Coronary artery disease involving native coronary artery of native heart without angina pectoris   5. Thyroid disease    PLAN:    In order of problems listed above:  Status post coronary bypass graft look good from that point review we will continue antiplatelets therapy. Dyslipidemia I did review his K PN which show me LDL 94 HDL 42 this is from September 2023 he is on high intense statin in form of Crestor 40 as well as Zetia 10 we will get another fasting lipid profile to see if we need to augment this therapy. Essential hypertension blood pressure well-controlled continue present management. Thyroid disease.  I will check his TSH he is taking Synthroid but complain of weakness fatigue and hair loss which could be related to hypothyroidism. I encouraged him to be more active and try to build up strength for his incoming trip to [place]   Medication Adjustments/Labs and Tests Ordered: Current medicines are reviewed at length with the patient today.  Concerns regarding medicines are outlined above.  No orders of the defined types were placed in this encounter.  Medication changes: No orders of the defined types were placed in this encounter.   Signed, Georgeanna Lea, MD, [facility] 01/31/2023 2:39 PM    [facility]

## 2023-01-31 NOTE — Patient Instructions (Signed)
Medication Instructions:  Your physician recommends that you continue on your current medications as directed. Please refer to the Current Medication list given to you today.  *If you need a refill on your cardiac medications before your next appointment, please call your pharmacy*   Lab Work: 3rd Floor [address] TSH, Lipid, AST, ALT- today If you have labs (blood work) drawn today and your tests are completely normal, you will receive your results only by: MyChart Message (if you have MyChart) OR A paper copy in the mail If you have any lab test that is abnormal or we need to change your treatment, we will call you to review the results.   Testing/Procedures: Your physician has requested that you have an echocardiogram. Echocardiography is a painless test that uses sound waves to create images of your heart. It provides your doctor with information about the size and shape of your heart and how well your heart's chambers and valves are working. This procedure takes approximately one hour. There are no restrictions for this procedure. Please do NOT wear cologne, perfume, aftershave, or lotions (deodorant is allowed). Please arrive 15 minutes prior to your appointment time.    Follow-Up: At [facility], you and your health needs are our priority.  As part of our continuing mission to provide you with exceptional heart care, we have created designated Provider Care Teams.  These Care Teams include your primary Cardiologist (physician) and Advanced Practice Providers (APPs -  Physician Assistants and Nurse Practitioners) who all work together to provide you with the care you need, when you need it.  We recommend signing up for the patient portal called "MyChart".  Sign up information is provided on this After Visit Summary.  MyChart is used to connect with patients for Virtual Visits (Telemedicine).  Patients are able to view lab/test results, encounter notes, upcoming appointments, etc.   Non-urgent messages can be sent to your provider as well.   To learn more about what you can do with MyChart, go to [email or web address].    Your next appointment:   6 month(s)  The format for your next appointment:   In Person  Provider:   Gypsy Balsam, MD    Other Instructions NA

## 2023-01-31 NOTE — Addendum Note (Signed)
Addended by: Baldo Ash D on: 01/31/2023 02:49 PM   Modules accepted: Orders

## 2023-02-01 LAB — LIPID PANEL
Chol/HDL Ratio: 3.7 ratio (ref 0.0–5.0)
Cholesterol, Total: 154 mg/dL (ref 100–199)
HDL: 42 mg/dL (ref >39)
LDL Chol Calc (NIH): 94 mg/dL (ref 0–99)
Triglycerides: 98 mg/dL (ref 0–149)
VLDL Cholesterol Cal: 18 mg/dL (ref 5–40)

## 2023-02-01 LAB — AST: AST: 24 IU/L (ref 0–40)

## 2023-02-01 LAB — TSH: TSH: 1.67 u[IU]/mL (ref 0.450–4.500)

## 2023-02-01 LAB — ALT: ALT: 34 IU/L (ref 0–44)

## 2023-02-09 ENCOUNTER — Telehealth: Payer: Self-pay

## 2023-02-09 DIAGNOSIS — I2510 Atherosclerotic heart disease of native coronary artery without angina pectoris: Secondary | ICD-10-CM

## 2023-02-09 DIAGNOSIS — E785 Hyperlipidemia, unspecified: Secondary | ICD-10-CM

## 2023-02-09 DIAGNOSIS — Z951 Presence of aortocoronary bypass graft: Secondary | ICD-10-CM

## 2023-02-09 MED ORDER — NEXLIZET 180-10 MG PO TABS: 1.0000 | ORAL_TABLET | Freq: Every day | ORAL | 12 refills | Status: AC

## 2023-02-09 NOTE — Telephone Encounter (Signed)
-----   Message from Georgeanna Lea, MD sent at 02/03/2023  4:54 PM EDT ----- Cholesterol still elevated, please switch Zetia to Nexlizet at, fasting lipid profile, AST LT 6 weeks

## 2023-02-14 ENCOUNTER — Telehealth: Payer: Self-pay | Admitting: Gastroenterology

## 2023-02-14 ENCOUNTER — Telehealth: Payer: Self-pay

## 2023-02-14 NOTE — Telephone Encounter (Signed)
I spoke to [place] at [facility] and informed her of your decision.

## 2023-02-14 NOTE — Telephone Encounter (Signed)
I appreciate the opportunity to try and help this patient. However, due to my availability and limited slots at this point I would not be able to accommodate any procedure until August or September. As such, for at least the next 1 to 2 months, I will not be able to help with these referrals. I would let the referring office know this and I am always going to reevaluate my availability but for now looks like this will need to go elsewhere. If for some reason the patient cannot be seen elsewhere, then I can be updated. GM

## 2023-02-14 NOTE — Telephone Encounter (Signed)
Dr. Meridee Score,  Please see referral from Dr. Bosie Clos.  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 Mailed colored copy of the report.   Records placed on your desk for review.  Thank you, Judeth Cornfield

## 2023-02-14 NOTE — Telephone Encounter (Signed)
Prior authorization has been started on [facility] for Nexlizet 180-10 mg. Key [id number].

## 2023-02-15 ENCOUNTER — Other Ambulatory Visit (HOSPITAL_COMMUNITY): Payer: Self-pay

## 2023-02-15 ENCOUNTER — Telehealth: Payer: Self-pay

## 2023-02-15 NOTE — Telephone Encounter (Signed)
Pharmacy Patient Advocate Encounter   Received notification from RN that prior authorization for NEXLIZET 180-10MG  is required/requested.   PA submitted on 5.21.24 to (ins) [employer] via CoverMyMeds Key # [id number]    Status is pending

## 2023-02-15 NOTE — Telephone Encounter (Signed)
Pharmacy Patient Advocate Encounter  Received notification from [facility] that the request for prior authorization for NEXLIZET 180/10MG  has been denied due to    Please see the Denial letter in the patients media.

## 2023-03-25 NOTE — Telephone Encounter (Signed)
Pharmacy Patient Advocate Encounter  Received notification from [employer] that the request for prior authorization for NEXLIZET 180/10MG has been denied due to    Please see the Denial letter in the patients media.     

## 2023-05-19 ENCOUNTER — Ambulatory Visit (HOSPITAL_BASED_OUTPATIENT_CLINIC_OR_DEPARTMENT_OTHER)
Admission: RE | Admit: 2023-05-19 | Discharge: 2023-05-19 | Disposition: A | Discharge status: Home / Self Care | Payer: Medicare Other | Source: Ambulatory Visit | Attending: Cardiology | Admitting: Cardiology

## 2023-05-19 DIAGNOSIS — R0609 Other forms of dyspnea: Secondary | ICD-10-CM | POA: Diagnosis present

## 2023-05-19 LAB — ECHOCARDIOGRAM COMPLETE
Area-P 1/2: 2.36 cm2
S' Lateral: 3.9 cm

## 2023-05-19 MED ORDER — PERFLUTREN LIPID MICROSPHERE
1.0000 mL | INTRAVENOUS | Status: AC | PRN
  Administered 2023-05-19: 8 mL via INTRAVENOUS

## 2023-06-07 ENCOUNTER — Encounter: Payer: Self-pay | Admitting: Cardiology

## 2023-06-07 ENCOUNTER — Ambulatory Visit: Payer: Medicare Other | Attending: Cardiology | Admitting: Cardiology

## 2023-06-07 VITALS — BP 124/74 | HR 85 | Ht 66.0 in | Wt 208.0 lb

## 2023-06-07 DIAGNOSIS — E785 Hyperlipidemia, unspecified: Secondary | ICD-10-CM

## 2023-06-07 DIAGNOSIS — Z951 Presence of aortocoronary bypass graft: Secondary | ICD-10-CM | POA: Diagnosis not present

## 2023-06-07 DIAGNOSIS — I10 Essential (primary) hypertension: Secondary | ICD-10-CM

## 2023-06-07 DIAGNOSIS — I2510 Atherosclerotic heart disease of native coronary artery without angina pectoris: Secondary | ICD-10-CM | POA: Diagnosis not present

## 2023-06-07 DIAGNOSIS — K602 Anal fissure, unspecified: Secondary | ICD-10-CM | POA: Insufficient documentation

## 2023-06-07 NOTE — Progress Notes (Signed)
Cardiology Office Note:    Date:  06/07/2023   ID:  Justin Johnston, DOB Apr 17, 1946, MRN [id number]  PCP:  Burton Apley, MD  Cardiologist:  Gypsy Balsam, MD    Referring MD: Burton Apley, MD     History of Present Illness:    Justin Johnston is a 77 y.o. male past medical history significant for coronary artery disease status post coronary bypass graft on 2012, last evaluation of his coronary artery was done in December 2020.  Stress test which was negative.  Additional issues include essential hypertension, dyslipidemia.  Comes today to months for follow-up overall doing great.  He went to [place] he enjoyed his tremendously.  Denies have any chest pain tightness squeezing pressure burning chest.  Recently I did echocardiogram him which showed preserved ejection fraction 6065% he read this and he understood that 40% of his heart is gone obviously he is very worried about this and we had a long discussion explained to him exactly what that means.  He is doing very well.  Past Medical History:  Diagnosis Date   Arthritis    Coronary artery disease    Hypertension    Rocky Mountain spotted fever    Thyroid disease     Past Surgical History:  Procedure Laterality Date   APPENDECTOMY     CARDIAC SURGERY     CORONARY ARTERY BYPASS GRAFT     KNEE SURGERY     TONSILLECTOMY      Current Medications: Current Meds  Medication Sig   amLODipine (NORVASC) 5 MG tablet Take 5 mg by mouth daily.   aspirin EC 81 MG tablet Take 1 tablet (81 mg total) by mouth daily. Swallow whole.   Bempedoic Acid-Ezetimibe (NEXLIZET) 180-10 MG TABS Take 1 tablet by mouth daily in the afternoon.   levothyroxine (SYNTHROID, LEVOTHROID) 50 MCG tablet Take 50 mcg by mouth daily before breakfast.    lisinopril (PRINIVIL,ZESTRIL) 20 MG tablet Take 1 tablet by mouth daily.    Multiple Vitamins-Minerals (PRESERVISION AREDS 2 PO) Take 2 capsules by mouth daily.   nitroGLYCERIN (NITROSTAT) 0.4 MG SL  tablet Place 1 tablet (0.4 mg total) under the tongue every 5 (five) minutes as needed for chest pain.   rosuvastatin (CRESTOR) 40 MG tablet Take 40 mg by mouth daily.   Testosterone 20 % CREA Apply 1 mL topically daily.     Allergies:   Patient has no known allergies.   Social History   Socioeconomic History   Marital status: Single    Spouse name: Not on file   Number of children: Not on file   Years of education: Not on file   Highest education level: Not on file  Occupational History   Not on file  Tobacco Use   Smoking status: Former    Passive exposure: Past   Smokeless tobacco: Never  Vaping Use   Vaping status: Never Used  Substance and Sexual Activity   Alcohol use: Yes   Drug use: No   Sexual activity: Not on file  Other Topics Concern   Not on file  Social History Narrative   Not on file   Social Determinants of Health   Financial Resource Strain: Not on file  Food Insecurity: Not on file  Transportation Needs: Not on file  Physical Activity: Not on file  Stress: Not on file  Social Connections: Not on file     Family History: The patient's family history includes Cancer in his father and mother. ROS:  Please see the history of present illness.    All 14 point review of systems negative except as described per history of present illness  EKGs/Labs/Other Studies Reviewed:         Recent Labs: 01/31/2023: ALT 34; TSH 1.670  Recent Lipid Panel    Component Value Date/Time   CHOL 154 01/31/2023 1456   TRIG 98 01/31/2023 1456   HDL 42 01/31/2023 1456   CHOLHDL 3.7 01/31/2023 1456   LDLCALC 94 01/31/2023 1456    Physical Exam:    VS:  BP 124/74 (BP Location: Left Arm, Patient Position: Sitting)   Pulse 85   Ht 5\' 6"  (1.676 m)   Wt 208 lb (94.3 kg)   SpO2 98%   BMI 33.57 kg/m     Wt Readings from Last 3 Encounters:  06/07/23 208 lb (94.3 kg)  01/31/23 214 lb (97.1 kg)  06/02/22 216 lb (98 kg)     GEN:  Well nourished, well developed  in no acute distress HEENT: Normal NECK: No JVD; No carotid bruits LYMPHATICS: No lymphadenopathy CARDIAC: RRR, no murmurs, no rubs, no gallops RESPIRATORY:  Clear to auscultation without rales, wheezing or rhonchi  ABDOMEN: Soft, non-tender, non-distended MUSCULOSKELETAL:  No edema; No deformity  SKIN: Warm and dry LOWER EXTREMITIES: no swelling NEUROLOGIC:  Alert and oriented x 3 PSYCHIATRIC:  Normal affect   ASSESSMENT:    1. Coronary artery disease involving native coronary artery of native heart without angina pectoris   2. Essential hypertension   3. Dyslipidemia   4. Status post coronary artery bypass graft    PLAN:    In order of problems listed above:  Coronary disease stable and appropriate medication which I will continue. Essential hypertension blood pressure well-controlled. Dyslipidemia he is on Crestor which is high intense statin he is also on Nexlizet at which I will continue.  I did review his K PN which show me his LDL of 94 HDL 42.  He wanted to talk to me about potentially exercises and we discussed need to increase level of exercise which she is trying to do.  We also talk about basic of Mediterranean diet give him 3 months and then repeat his fasting lipid profile.  He may require PCSK9 agent   Medication Adjustments/Labs and Tests Ordered: Current medicines are reviewed at length with the patient today.  Concerns regarding medicines are outlined above.  No orders of the defined types were placed in this encounter.  Medication changes: No orders of the defined types were placed in this encounter.   Signed, Georgeanna Lea, MD, [facility] 06/07/2023 2:16 PM    [facility]

## 2023-06-07 NOTE — Patient Instructions (Signed)

## 2023-08-18 ENCOUNTER — Ambulatory Visit
Admission: RE | Admit: 2023-08-18 | Discharge: 2023-08-18 | Disposition: A | Discharge status: Home / Self Care | Payer: Medicare Other | Source: Ambulatory Visit | Attending: Internal Medicine | Admitting: Internal Medicine

## 2023-08-18 ENCOUNTER — Other Ambulatory Visit: Payer: Self-pay | Admitting: Internal Medicine

## 2023-08-18 DIAGNOSIS — R059 Cough, unspecified: Secondary | ICD-10-CM

## 2023-11-23 ENCOUNTER — Other Ambulatory Visit: Payer: Self-pay | Admitting: Cardiology

## 2024-01-27 ENCOUNTER — Ambulatory Visit: Admitting: Cardiology

## 2024-02-07 ENCOUNTER — Ambulatory Visit: Attending: Cardiology | Admitting: Cardiology

## 2024-02-07 ENCOUNTER — Ambulatory Visit: Admitting: Cardiology

## 2024-02-07 ENCOUNTER — Encounter: Payer: Self-pay | Admitting: Cardiology

## 2024-02-07 VITALS — BP 128/86 | HR 65 | Ht 66.0 in | Wt 203.0 lb

## 2024-02-07 DIAGNOSIS — I10 Essential (primary) hypertension: Secondary | ICD-10-CM

## 2024-02-07 DIAGNOSIS — E785 Hyperlipidemia, unspecified: Secondary | ICD-10-CM

## 2024-02-07 DIAGNOSIS — Z951 Presence of aortocoronary bypass graft: Secondary | ICD-10-CM

## 2024-02-07 DIAGNOSIS — I2510 Atherosclerotic heart disease of native coronary artery without angina pectoris: Secondary | ICD-10-CM | POA: Diagnosis not present

## 2024-02-07 NOTE — Progress Notes (Signed)
 Cardiology Office Note:    Date:  02/07/2024   ID:  Justin Johnston, DOB May 23, 1946, MRN [id number]  PCP:  Dudley Ghee, MD  Cardiologist:  Ralene Burger, MD    Referring MD: Dudley Ghee, MD   Chief Complaint  Patient presents with   Dizziness   cold sweet    History of Present Illness:    Justin Johnston is a 78 y.o. male past medical history significant for coronary artery disease, status post coronary bypass graft in 2012, last evaluation of coronary artery disease was done in December 2023 and from a stress test which was negative additional issues include hypertension dyslipidemia.  He comes today to my office for follow-up recently he spent 2 weeks in [place] he enjoyed tremendously make a lot of picture and doing well.  However he describes 1 episode few days ago when he suddenly became profoundly weak exhausted tired blood pressure drops no chest pain tightness squeezing pressure burning chest.  He also reported to be profoundly weak tired and exhausted.  He described the fact that he got some dry mouth at night he wakes up many times with dry mouth on top of that is very easy for him to fall asleep during the day.  Past Medical History:  Diagnosis Date   Arthritis    Coronary artery disease    Hypertension    Rocky Mountain spotted fever    Thyroid disease     Past Surgical History:  Procedure Laterality Date   APPENDECTOMY     CARDIAC SURGERY     CORONARY ARTERY BYPASS GRAFT     KNEE SURGERY     TONSILLECTOMY      Current Medications: Current Meds  Medication Sig   amLODipine  (NORVASC ) 5 MG tablet Take 5 mg by mouth daily.   aspirin  EC 81 MG tablet Take 1 tablet (81 mg total) by mouth daily. Swallow whole.   Bempedoic Acid-Ezetimibe  (NEXLIZET ) 180-10 MG TABS Take 1 tablet by mouth daily in the afternoon.   levothyroxine (SYNTHROID, LEVOTHROID) 50 MCG tablet Take 50 mcg by mouth daily before breakfast.    lisinopril (PRINIVIL,ZESTRIL) 20 MG tablet Take 1  tablet by mouth daily.    Multiple Vitamins-Minerals (PRESERVISION AREDS 2 PO) Take 2 capsules by mouth daily.   nitroGLYCERIN  (NITROSTAT ) 0.4 MG SL tablet Place 1 tablet (0.4 mg total) under the tongue every 5 (five) minutes as needed for chest pain.   rosuvastatin (CRESTOR) 40 MG tablet Take 40 mg by mouth daily.   Testosterone 20 % CREA Apply 1 mL topically daily.     Allergies:   Patient has no known allergies.   Social History   Socioeconomic History   Marital status: Single    Spouse name: Not on file   Number of children: Not on file   Years of education: Not on file   Highest education level: Not on file  Occupational History   Not on file  Tobacco Use   Smoking status: Former    Passive exposure: Past   Smokeless tobacco: Never  Vaping Use   Vaping status: Never Used  Substance and Sexual Activity   Alcohol use: Yes   Drug use: No   Sexual activity: Not on file  Other Topics Concern   Not on file  Social History Narrative   Not on file   Social Drivers of Health   Financial Resource Strain: Not on file  Food Insecurity: Not on file  Transportation Needs: Not on file  Physical  Activity: Not on file  Stress: Not on file  Social Connections: Not on file     Family History: The patient's family history includes Cancer in his father and mother. ROS:   Please see the history of present illness.    All 14 point review of systems negative except as described per history of present illness  EKGs/Labs/Other Studies Reviewed:    EKG Interpretation Date/Time:  Tuesday Feb 07 2024 15:27:53 EDT Ventricular Rate:  70 PR Interval:  174 QRS Duration:  98 QT Interval:  390 QTC Calculation: 421 R Axis:   49  Text Interpretation: Sinus rhythm with Premature atrial complexes No previous ECGs available Confirmed by Ralene Burger [phone]) on 02/07/2024 3:38:28 PM    Recent Labs: No results found for requested labs within last 365 days.  Recent Lipid Panel     Component Value Date/Time   CHOL 154 01/31/2023 1456   TRIG 98 01/31/2023 1456   HDL 42 01/31/2023 1456   CHOLHDL 3.7 01/31/2023 1456   LDLCALC 94 01/31/2023 1456    Physical Exam:    VS:  BP 128/86 (BP Location: Right Arm, Patient Position: Sitting)   Pulse 65   Ht 5\' 6"  (1.676 m)   Wt 203 lb (92.1 kg)   SpO2 98%   BMI 32.77 kg/m     Wt Readings from Last 3 Encounters:  02/07/24 203 lb (92.1 kg)  06/07/23 208 lb (94.3 kg)  01/31/23 214 lb (97.1 kg)     GEN:  Well nourished, well developed in no acute distress HEENT: Normal NECK: No JVD; No carotid bruits LYMPHATICS: No lymphadenopathy CARDIAC: RRR, no murmurs, no rubs, no gallops RESPIRATORY:  Clear to auscultation without rales, wheezing or rhonchi  ABDOMEN: Soft, non-tender, non-distended MUSCULOSKELETAL:  No edema; No deformity  SKIN: Warm and dry LOWER EXTREMITIES: no swelling NEUROLOGIC:  Alert and oriented x 3 PSYCHIATRIC:  Normal affect   ASSESSMENT:    1. Essential hypertension   2. Coronary artery disease involving native coronary artery of native heart without angina pectoris   3. Dyslipidemia   4. Status post coronary artery bypass graft    PLAN:    In order of problems listed above:  Coronary artery disease some worries about his symptoms that he described to me.  Even though lack of typical chest tightness and pressure in the chest but episode of fatigue tiredness could represent angina equivalent on top of that he had 1 episode that his blood pressure was very low.  Will schedule him to have echocardiogram to assess left ventricular ejection fraction as well as stress test exercise Cardiolite to rule out any significant coronary disease. Essential hypertension blood pressure well-controlled continue present management. Obstructive sleep apnea which I strongly suspect excessive daytime somnolence is still.  I will schedule him to have sleep study. Dyslipidemia I did review K PN which show me LDL of  94 HDL 42 this is from summer of last year.  Will repeat the test.   Medication Adjustments/Labs and Tests Ordered: Current medicines are reviewed at length with the patient today.  Concerns regarding medicines are outlined above.  Orders Placed This Encounter  Procedures   EKG 12-Lead   Medication changes: No orders of the defined types were placed in this encounter.   Signed, Manfred Seed, MD, [facility] 02/07/2024 3:59 PM    [facility]

## 2024-02-07 NOTE — Addendum Note (Signed)
 Addended by: Aurelio Leer I on: 02/07/2024 04:16 PM   Modules accepted: Orders

## 2024-02-07 NOTE — Addendum Note (Signed)
 Addended by: Aurelio Leer I on: 02/07/2024 04:31 PM   Modules accepted: Orders

## 2024-02-07 NOTE — Patient Instructions (Signed)
 Medication Instructions:  Your physician recommends that you continue on your current medications as directed. Please refer to the Current Medication list given to you today.  *If you need a refill on your cardiac medications before your next appointment, please call your pharmacy*  Lab Work: Your physician recommends that you return for lab work in:   Labs today: Lipid  If you have labs (blood work) drawn today and your tests are completely normal, you will receive your results only by: MyChart Message (if you have MyChart) OR A paper copy in the mail If you have any lab test that is abnormal or we need to change your treatment, we will call you to review the results.  Testing/Procedures:   [facility] at [facility] [address] [place], [place] [address] Phone: [phone]    Please arrive 15 minutes prior to your appointment time for registration and insurance purposes.  The test will take approximately 3 to 4 hours to complete; you may bring reading material.  If someone comes with you to your appointment, they will need to remain in the main lobby due to limited space in the testing area. **If you are pregnant or breastfeeding, please notify the nuclear lab prior to your appointment**  How to prepare for your Myocardial Perfusion Test: Do not eat or drink 3 hours prior to your test, except you may have water. Do not consume products containing caffeine (regular or decaffeinated) 12 hours prior to your test. (ex: coffee, chocolate, sodas, tea). Do bring a list of your current medications with you.  If not listed below, you may take your medications as normal. Do wear comfortable clothes (no dresses or overalls) and walking shoes, tennis shoes preferred (No heels or open toe shoes are allowed). Do NOT wear cologne, perfume, aftershave, or lotions (deodorant is allowed). If these instructions are not followed, your test will have to be  rescheduled.  Please report to [address] ([facility] 2nd Floor, for your test.  If you have questions or concerns about your appointment, you can call the Nuclear Lab at [phone].  If you cannot keep your appointment, please provide 24 hours notification to the Nuclear Lab, to avoid a possible $50 charge to your account.  Your physician has requested that you have an echocardiogram. Echocardiography is a painless test that uses sound waves to create images of your heart. It provides your doctor with information about the size and shape of your heart and how well your heart's chambers and valves are working. This procedure takes approximately one hour. There are no restrictions for this procedure. Please do NOT wear cologne, perfume, aftershave, or lotions (deodorant is allowed). Please arrive 15 minutes prior to your appointment time.  Please note: We ask at that you not bring children with you during ultrasound (echo/ vascular) testing. Due to room size and safety concerns, children are not allowed in the ultrasound rooms during exams. Our front office staff cannot provide observation of children in our lobby area while testing is being conducted. An adult accompanying a patient to their appointment will only be allowed in the ultrasound room at the discretion of the ultrasound technician under special circumstances. We apologize for any inconvenience.  [place] Sleep Study  Follow-Up: At [facility], you and your health needs are our priority.  As part of our continuing mission to provide you with exceptional heart care, our providers are all part of one  team.  This team includes your primary Cardiologist (physician) and Advanced Practice Providers or APPs (Physician Assistants and Nurse Practitioners) who all work together to provide you with the care you need, when you need it.  Your next appointment:   3 month(s)  Provider:    Ralene Burger, MD    We recommend signing up for the patient portal called "MyChart".  Sign up information is provided on this After Visit Summary.  MyChart is used to connect with patients for Virtual Visits (Telemedicine).  Patients are able to view lab/test results, encounter notes, upcoming appointments, etc.  Non-urgent messages can be sent to your provider as well.   To learn more about what you can do with MyChart, go to [email or web address].   Other Instructions None

## 2024-02-08 ENCOUNTER — Telehealth: Payer: Self-pay

## 2024-02-08 NOTE — Telephone Encounter (Signed)
 Patient agreement reviewed and signed on 02/07/24.  WatchPAT issued to patient on 02/07/24 by Dondra Fuel, RN. Patient aware to not open the WatchPAT box until contacted with the activation PIN. Patient profile initialized in CloudPAT on 02/08/2024 by Aurelio Leer RN. Device serial number: [id number]  Please list Reason for Call as Advice Only and type "WatchPAT issued to patient" in the comment box.

## 2024-02-14 NOTE — Addendum Note (Signed)
 Addended by: Ralene Burger on: 02/14/2024 08:43 AM   Modules accepted: Orders

## 2024-03-02 ENCOUNTER — Encounter (HOSPITAL_COMMUNITY): Payer: Self-pay | Admitting: *Deleted

## 2024-03-07 ENCOUNTER — Other Ambulatory Visit: Payer: Self-pay | Admitting: Cardiology

## 2024-03-07 DIAGNOSIS — Z951 Presence of aortocoronary bypass graft: Secondary | ICD-10-CM

## 2024-03-07 DIAGNOSIS — E785 Hyperlipidemia, unspecified: Secondary | ICD-10-CM

## 2024-03-07 DIAGNOSIS — I2510 Atherosclerotic heart disease of native coronary artery without angina pectoris: Secondary | ICD-10-CM

## 2024-03-07 DIAGNOSIS — I10 Essential (primary) hypertension: Secondary | ICD-10-CM

## 2024-03-12 ENCOUNTER — Telehealth (HOSPITAL_COMMUNITY): Payer: Self-pay | Admitting: *Deleted

## 2024-03-12 ENCOUNTER — Encounter (HOSPITAL_COMMUNITY): Payer: Self-pay | Admitting: *Deleted

## 2024-03-12 NOTE — Telephone Encounter (Signed)
 Reminder and instruction letter for upcoming stress test  on 03/19/24 sent via [employer]

## 2024-03-19 ENCOUNTER — Ambulatory Visit (HOSPITAL_COMMUNITY)
Admission: RE | Admit: 2024-03-19 | Discharge: 2024-03-19 | Disposition: A | Discharge status: Home / Self Care | Source: Ambulatory Visit | Attending: Cardiology | Admitting: Cardiology

## 2024-03-19 ENCOUNTER — Ambulatory Visit (HOSPITAL_BASED_OUTPATIENT_CLINIC_OR_DEPARTMENT_OTHER)
Admission: RE | Admit: 2024-03-19 | Discharge: 2024-03-19 | Disposition: A | Discharge status: Home / Self Care | Source: Ambulatory Visit | Attending: Cardiology | Admitting: Cardiology

## 2024-03-19 DIAGNOSIS — Z951 Presence of aortocoronary bypass graft: Secondary | ICD-10-CM

## 2024-03-19 DIAGNOSIS — E785 Hyperlipidemia, unspecified: Secondary | ICD-10-CM

## 2024-03-19 DIAGNOSIS — I2510 Atherosclerotic heart disease of native coronary artery without angina pectoris: Secondary | ICD-10-CM

## 2024-03-19 DIAGNOSIS — I10 Essential (primary) hypertension: Secondary | ICD-10-CM | POA: Diagnosis not present

## 2024-03-19 LAB — MYOCARDIAL PERFUSION IMAGING
Angina Index: 0
Duke Treadmill Score: 8
Estimated workload: 10.3
Exercise duration (min): 8 min
Exercise duration (sec): 11 s
LV dias vol: 140 mL (ref 62–150)
LV sys vol: 63 mL (ref 4.2–5.8)
MPHR: 143 {beats}/min
Nuc Stress EF: 55 %
Peak HR: 127 {beats}/min
Percent HR: 88 %
Rest HR: 53 {beats}/min
Rest Nuclear Isotope Dose: 11 mCi
SDS: 0
SRS: 4
SSS: 1
ST Depression (mm): 0 mm
Stress Nuclear Isotope Dose: 32.9 mCi
TID: 1.06

## 2024-03-19 LAB — ECHOCARDIOGRAM COMPLETE
Area-P 1/2: 3.77 cm2
S' Lateral: 3.8 cm

## 2024-03-19 MED ORDER — TECHNETIUM TC 99M TETROFOSMIN IV KIT
32.9000 | PACK | Freq: Once | INTRAVENOUS | Status: AC | PRN
  Administered 2024-03-19: 32.9 via INTRAVENOUS

## 2024-03-19 MED ORDER — PERFLUTREN LIPID MICROSPHERE
1.0000 mL | INTRAVENOUS | Status: AC | PRN
  Administered 2024-03-19: 2 mL via INTRAVENOUS

## 2024-03-19 MED ORDER — TECHNETIUM TC 99M TETROFOSMIN IV KIT
11.0000 | PACK | Freq: Once | INTRAVENOUS | Status: AC | PRN
  Administered 2024-03-19: 11 via INTRAVENOUS

## 2024-03-21 ENCOUNTER — Ambulatory Visit: Payer: Self-pay | Admitting: Cardiology

## 2024-03-26 ENCOUNTER — Ambulatory Visit: Payer: Self-pay | Admitting: Cardiology

## 2024-04-11 ENCOUNTER — Telehealth: Payer: Self-pay

## 2024-04-11 NOTE — Telephone Encounter (Signed)
 Left message on My Chart with stress test  & Echo results per Dr. Karry note. Routed to PCP.

## 2024-04-11 NOTE — Telephone Encounter (Signed)
 Left message on My Chart with Stress test results per Dr. Vanetta Shawl note. Routed to PCP.

## 2024-04-18 ENCOUNTER — Telehealth: Payer: Self-pay

## 2024-04-18 NOTE — Telephone Encounter (Signed)
 Pt viewed Echo results on My Chart per Dr. Vanetta Shawl note. Routed to PCP.

## 2024-05-03 ENCOUNTER — Other Ambulatory Visit: Payer: Self-pay | Admitting: Internal Medicine

## 2024-05-03 DIAGNOSIS — E041 Nontoxic single thyroid nodule: Secondary | ICD-10-CM

## 2024-05-04 ENCOUNTER — Ambulatory Visit
Admission: RE | Admit: 2024-05-04 | Discharge: 2024-05-04 | Disposition: A | Discharge status: Home / Self Care | Source: Ambulatory Visit | Attending: Internal Medicine | Admitting: Internal Medicine

## 2024-05-04 ENCOUNTER — Ambulatory Visit
Admission: RE | Admit: 2024-05-04 | Discharge: 2024-05-04 | Disposition: A | Discharge status: Home / Self Care | Source: Ambulatory Visit

## 2024-05-04 ENCOUNTER — Other Ambulatory Visit: Payer: Self-pay

## 2024-05-04 DIAGNOSIS — R140 Abdominal distension (gaseous): Secondary | ICD-10-CM

## 2024-05-04 DIAGNOSIS — E041 Nontoxic single thyroid nodule: Secondary | ICD-10-CM

## 2024-05-07 ENCOUNTER — Telehealth: Payer: Self-pay

## 2024-05-07 NOTE — Telephone Encounter (Signed)
 Pt viewed stress test results on My Chart per Dr. Vanetta Shawl note. Routed to PCP.

## 2024-05-07 NOTE — Telephone Encounter (Signed)
 Pt viewed stress test results on My Chart per Dr. Karry note. Routed to PCP.

## 2024-05-09 ENCOUNTER — Encounter: Payer: Self-pay | Admitting: Cardiology

## 2024-05-09 ENCOUNTER — Ambulatory Visit: Attending: Cardiology | Admitting: Cardiology

## 2024-05-09 VITALS — BP 130/74 | HR 62 | Ht 66.0 in | Wt 206.8 lb

## 2024-05-09 DIAGNOSIS — I2510 Atherosclerotic heart disease of native coronary artery without angina pectoris: Secondary | ICD-10-CM | POA: Diagnosis not present

## 2024-05-09 DIAGNOSIS — Z951 Presence of aortocoronary bypass graft: Secondary | ICD-10-CM | POA: Diagnosis not present

## 2024-05-09 DIAGNOSIS — I10 Essential (primary) hypertension: Secondary | ICD-10-CM

## 2024-05-09 DIAGNOSIS — R051 Acute cough: Secondary | ICD-10-CM

## 2024-05-09 DIAGNOSIS — E785 Hyperlipidemia, unspecified: Secondary | ICD-10-CM

## 2024-05-09 MED ORDER — ALBUTEROL SULFATE HFA 108 (90 BASE) MCG/ACT IN AERS: 2.0000 | INHALATION_SPRAY | Freq: Four times a day (QID) | RESPIRATORY_TRACT | 1 refills | Status: DC | PRN

## 2024-05-09 MED ORDER — BENZONATATE 200 MG PO CAPS: 200.0000 mg | ORAL_CAPSULE | Freq: Four times a day (QID) | ORAL | 1 refills | Status: DC | PRN

## 2024-05-09 NOTE — Addendum Note (Signed)
 Addended by: ARLOA PLANAS D on: 05/09/2024 02:51 PM   Modules accepted: Orders

## 2024-05-09 NOTE — Patient Instructions (Addendum)
 Medication Instructions:   START: Tessalon  Perles 200mg  1 every 6 hours as needed for cough  START: Albuterol  Inhaler- 2 puffs every 4 hours as needed    Lab Work: CMP, CBC, - today 3rd Floor [address] If you have labs (blood work) drawn today and your tests are completely normal, you will receive your results only by: [employer] (if you have MyChart) OR A paper copy in the mail If you have any lab test that is abnormal or we need to change your treatment, we will call you to review the results.   Testing/Procedures: Non-Cardiac CT scanning, (CAT scanning), is a noninvasive, special x-ray that produces cross-sectional images of the body using x-rays and a computer. CT scans help physicians diagnose and treat medical conditions. For some CT exams, a contrast material is used to enhance visibility in the area of the body being studied. CT scans provide greater clarity and reveal more details than regular x-ray exams.    Follow-Up: At [facility], you and your health needs are our priority.  As part of our continuing mission to provide you with exceptional heart care, we have created designated Provider Care Teams.  These Care Teams include your primary Cardiologist (physician) and Advanced Practice Providers (APPs -  Physician Assistants and Nurse Practitioners) who all work together to provide you with the care you need, when you need it.  We recommend signing up for the patient portal called MyChart.  Sign up information is provided on this After Visit Summary.  MyChart is used to connect with patients for Virtual Visits (Telemedicine).  Patients are able to view lab/test results, encounter notes, upcoming appointments, etc.  Non-urgent messages can be sent to your provider as well.   To learn more about what you can do with MyChart, go to [email or web address].    Your next appointment:   3 month(s)  The format for your next appointment:   In Person  Provider:   Lamar Fitch, MD    Other Instructions NA

## 2024-05-09 NOTE — Progress Notes (Signed)
 Cardiology Office Note:    Date:  05/09/2024   ID:  Estefano Victory, DOB Jun 08, 1946, MRN [id number]  PCP:  Henry Ingle, MD  Cardiologist:  Lamar Fitch, MD    Referring MD: Henry Ingle, MD   No chief complaint on file.   History of Present Illness:    Justin Johnston is a 78 y.o. male past medical history significant for coronary disease.  In 2012 he did have coronary bypass graft, last evaluation of his coronary arteries was done in the form of stress test done in July of this year which was negative, he also had to have essential hypertension, thyroid  problem.  Recently ended up going for a trip to [place]  he got sick over the end up going to 2 courses of antibiotic still not feeling up to the prior still coughing a lot he had difficulty breathing, likely saturation is 98%, he is not feeling good is not looking good he does not have any fever no chills no swelling of lower extremities chest pain is clearly pleuritic interesting he described the first thing would happen when he was on the trip he started having pain in the right groin he called his primary care physician that he sent him steroids that seems to be helping but at the same time he started getting sick he is thinking and probably logically that the steroids will reduce his resistance and he got some bacteria over there.  Past Medical History:  Diagnosis Date   Arthritis    Coronary artery disease    Hypertension    Rocky Mountain spotted fever    Thyroid  disease     Past Surgical History:  Procedure Laterality Date   APPENDECTOMY     CARDIAC SURGERY     CORONARY ARTERY BYPASS GRAFT     KNEE SURGERY     TONSILLECTOMY      Current Medications: Current Meds  Medication Sig   amLODipine  (NORVASC ) 5 MG tablet Take 5 mg by mouth daily.   Bempedoic Acid-Ezetimibe  (NEXLIZET ) 180-10 MG TABS Take 1 tablet by mouth daily in the afternoon.   levothyroxine (SYNTHROID, LEVOTHROID) 50 MCG tablet Take 50 mcg by mouth  daily before breakfast.    lisinopril (PRINIVIL,ZESTRIL) 20 MG tablet Take 1 tablet by mouth daily.    Multiple Vitamins-Minerals (PRESERVISION AREDS 2 PO) Take 2 capsules by mouth daily.   nitroGLYCERIN  (NITROSTAT ) 0.4 MG SL tablet Place 1 tablet (0.4 mg total) under the tongue every 5 (five) minutes as needed for chest pain.   rosuvastatin (CRESTOR) 40 MG tablet Take 40 mg by mouth daily.     Allergies:   Patient has no known allergies.   Social History   Socioeconomic History   Marital status: Single    Spouse name: Not on file   Number of children: Not on file   Years of education: Not on file   Highest education level: Not on file  Occupational History   Not on file  Tobacco Use   Smoking status: Former    Passive exposure: Past   Smokeless tobacco: Never  Vaping Use   Vaping status: Never Used  Substance and Sexual Activity   Alcohol use: Yes   Drug use: No   Sexual activity: Not on file  Other Topics Concern   Not on file  Social History Narrative   Not on file   Social Drivers of Health   Financial Resource Strain: Not on file  Food Insecurity: Not on file  Transportation Needs:  Not on file  Physical Activity: Not on file  Stress: Not on file  Social Connections: Not on file     Family History: The patient's family history includes Cancer in his father and mother. ROS:   Please see the history of present illness.    All 14 point review of systems negative except as described per history of present illness  EKGs/Labs/Other Studies Reviewed:         Recent Labs: No results found for requested labs within last 365 days.  Recent Lipid Panel    Component Value Date/Time   CHOL 154 01/31/2023 1456   TRIG 98 01/31/2023 1456   HDL 42 01/31/2023 1456   CHOLHDL 3.7 01/31/2023 1456   LDLCALC 94 01/31/2023 1456    Physical Exam:    VS:  BP 130/74   Pulse 62   Ht 5' 6 (1.676 m)   Wt 206 lb 12.8 oz (93.8 kg)   SpO2 98%   BMI 33.38 kg/m     Wt  Readings from Last 3 Encounters:  05/09/24 206 lb 12.8 oz (93.8 kg)  02/07/24 203 lb (92.1 kg)  06/07/23 208 lb (94.3 kg)     GEN:  Well nourished, well developed in no acute distress HEENT: Normal NECK: No JVD; No carotid bruits LYMPHATICS: No lymphadenopathy CARDIAC: RRR, no murmurs, no rubs, no gallops RESPIRATORY:  Clear to auscultation without rales, wheezing or rhonchi  ABDOMEN: Soft, non-tender, non-distended MUSCULOSKELETAL:  No edema; No deformity  SKIN: Warm and dry LOWER EXTREMITIES: no swelling NEUROLOGIC:  Alert and oriented x 3 PSYCHIATRIC:  Normal affect   ASSESSMENT:    1. Coronary artery disease involving native coronary artery of native heart without angina pectoris   2. Essential hypertension   3. Status post coronary artery bypass graft   4. Dyslipidemia    PLAN:    In order of problems listed above:  Coronary artery disease stable from that point review of stress test negative. Cough and not feeling well.  Will try to make appointment with pulmonologist from him, will do CBC plus differential today, complete metabolic panel, will schedule him to have CT of his chest, Essential hypertension blood pressure acceptable continue present management. Dyslipidemia the send will repeat fasting lipid profile but he have to recover from the sickness, we will give him also Tessalon  Perles   Medication Adjustments/Labs and Tests Ordered: Current medicines are reviewed at length with the patient today.  Concerns regarding medicines are outlined above.  No orders of the defined types were placed in this encounter.  Medication changes: No orders of the defined types were placed in this encounter.   Signed, Lamar DOROTHA Fitch, MD, [facility] 05/09/2024 2:32 PM    [facility]

## 2024-05-10 ENCOUNTER — Ambulatory Visit: Payer: Self-pay

## 2024-05-10 LAB — CBC
Hematocrit: 44.7 % (ref 37.5–51.0)
Hemoglobin: 15 g/dL (ref 13.0–17.7)
MCH: 32.7 pg (ref 26.6–33.0)
MCHC: 33.6 g/dL (ref 31.5–35.7)
MCV: 97 fL (ref 79–97)
Platelets: 179 x10E3/uL (ref 150–450)
RBC: 4.59 x10E6/uL (ref 4.14–5.80)
RDW: 12.3 % (ref 11.6–15.4)
WBC: 6.6 x10E3/uL (ref 3.4–10.8)

## 2024-05-10 LAB — COMPREHENSIVE METABOLIC PANEL WITH GFR
ALT: 60 IU/L — ABNORMAL HIGH (ref 0–44)
AST: 28 IU/L (ref 0–40)
Albumin: 4.3 g/dL (ref 3.8–4.8)
Alkaline Phosphatase: 79 IU/L (ref 44–121)
BUN/Creatinine Ratio: 18 (ref 10–24)
BUN: 14 mg/dL (ref 8–27)
Bilirubin Total: 0.9 mg/dL (ref 0.0–1.2)
CO2: 22 mmol/L (ref 20–29)
Calcium: 10.3 mg/dL — ABNORMAL HIGH (ref 8.6–10.2)
Chloride: 105 mmol/L (ref 96–106)
Creatinine, Ser: 0.78 mg/dL (ref 0.76–1.27)
Globulin, Total: 2.2 g/dL (ref 1.5–4.5)
Glucose: 92 mg/dL (ref 70–99)
Potassium: 4.5 mmol/L (ref 3.5–5.2)
Sodium: 141 mmol/L (ref 134–144)
Total Protein: 6.5 g/dL (ref 6.0–8.5)
eGFR: 92 mL/min/1.73 (ref >59)

## 2024-05-10 NOTE — Telephone Encounter (Signed)
 FYI Only or Action Required?: FYI only for provider.  Calling to make a new patient appointment with pulmonary.   Called Nurse Triage reporting Cough.  Symptoms began several days ago.  Interventions attempted: Rescue inhaler and Increased fluids/rest.  Symptoms are: unchanged.  Triage Disposition: See HCP Within 4 Hours (Or PCP Triage)-follow up with PCP or ED for any concerns.   Patient/caregiver understands and will follow disposition?: No, wishes to speak with PCP  Copied from CRM [id number]. Topic: Clinical - Red Word Triage >> May 10, 2024 12:05 PM Celestine FALCON wrote: Red Word that prompted transfer to Nurse Triage: Pt is a new patient that is not established. There is a referral in the pt's chart from his cardiologist.   Pt went to [place]  on vacation and had to be admitted to the hospital there due to having issues with a cough. It has been 15 days since and he's having wheezing, shortness of breath, and a severe cough. Pt stated at the hospital they did a chest ct showing mild pneumonia and was put on medication to assist. >> May 10, 2024 12:12 PM Celestine FALCON wrote: Pt wanted to add that he has a heart condition; the pt has had a triple bypass. Reason for Disposition  [1] MILD difficulty breathing (e.g., minimal/no SOB at rest, SOB with walking, pulse < 100) AND [2] still present when not coughing  Answer Assessment - Initial Assessment Questions 1. ONSET: When did the cough begin?      Started while patient was in [place] .  2. SEVERITY: How bad is the cough today?      6 out of 10 3. SPUTUM: Describe the color of your sputum (e.g., none, dry cough; clear, white, yellow, green)     Clear to white 4. HEMOPTYSIS: Are you coughing up any blood? If Yes, ask: How much? (e.g., flecks, streaks, tablespoons, etc.)     No blood 5. DIFFICULTY BREATHING: Are you having difficulty breathing? If Yes, ask: How bad is it? (e.g., mild, moderate, severe)      Mild but worsens when he  goes outside.  6. FEVER: Do you have a fever? If Yes, ask: What is your temperature, how was it measured, and when did it start?     no 7. CARDIAC HISTORY: Do you have any history of heart disease? (e.g., heart attack, congestive heart failure)      Triple bypass 8. LUNG HISTORY: Do you have any history of lung disease?  (e.g., pulmonary embolus, asthma, emphysema)     Bronchitis, 25 years since he smoked 9. PE RISK FACTORS: Do you have a history of blood clots? (or: recent major surgery, recent prolonged travel, bedridden)     Recent travel 10. OTHER SYMPTOMS: Do you have any other symptoms? (e.g., runny nose, wheezing, chest pain)       Wheezing, chest soreness.  12. TRAVEL: Have you traveled out of the country in the last month? (e.g., travel history, exposures)       Yes went to [place]   Protocols used: Cough - Acute Productive-A-AH

## 2024-05-14 ENCOUNTER — Ambulatory Visit: Payer: Self-pay | Admitting: Cardiology

## 2024-05-18 ENCOUNTER — Telehealth: Payer: Self-pay

## 2024-05-18 NOTE — Telephone Encounter (Signed)
 Left message on My Chart with lab results per Dr. Karry note. Routed to PCP

## 2024-05-19 ENCOUNTER — Ambulatory Visit (HOSPITAL_BASED_OUTPATIENT_CLINIC_OR_DEPARTMENT_OTHER)
Admission: RE | Admit: 2024-05-19 | Discharge: 2024-05-19 | Disposition: A | Discharge status: Home / Self Care | Source: Ambulatory Visit | Attending: Cardiology | Admitting: Cardiology

## 2024-05-19 DIAGNOSIS — R051 Acute cough: Secondary | ICD-10-CM | POA: Insufficient documentation

## 2024-05-24 ENCOUNTER — Other Ambulatory Visit: Payer: Self-pay | Admitting: Internal Medicine

## 2024-05-24 DIAGNOSIS — N281 Cyst of kidney, acquired: Secondary | ICD-10-CM

## 2024-05-29 ENCOUNTER — Ambulatory Visit
Admission: RE | Admit: 2024-05-29 | Discharge: 2024-05-29 | Disposition: A | Discharge status: Home / Self Care | Source: Ambulatory Visit | Attending: Internal Medicine | Admitting: Internal Medicine

## 2024-05-29 DIAGNOSIS — N281 Cyst of kidney, acquired: Secondary | ICD-10-CM

## 2024-05-30 ENCOUNTER — Telehealth: Payer: Self-pay

## 2024-05-30 NOTE — Telephone Encounter (Signed)
 Pt viewed lab results on My Chart per Dr. Karry note. Routed to PCP.

## 2024-06-14 ENCOUNTER — Ambulatory Visit: Admitting: Pulmonary Disease

## 2024-06-26 ENCOUNTER — Telehealth: Payer: Self-pay

## 2024-06-26 NOTE — Telephone Encounter (Signed)
 Ordering provider: Lamar Fitch, MD Associated diagnoses:  Essential hypertension Coronary artery disease involving native coronary artery of native heart without angina pectoris Dyslipidemia Status post coronary artery bypass graft WatchPAT PA obtained on 06/26/2024 by Lucie DELENA Ku, CMA. Authorization: No prior authorization is required per [facility] Medicare Patient notified of PIN ([id number]) on 06/26/2024 via Notification Method: MyChart message.  Phone note routed to covering staff for follow-up.

## 2024-06-27 ENCOUNTER — Other Ambulatory Visit: Payer: Self-pay | Admitting: Cardiology

## 2024-06-28 NOTE — Telephone Encounter (Signed)
 Pt of Dr. Krasowski. Does Dr. Bernie want to refill this RX? Please advise.

## 2024-06-29 ENCOUNTER — Ambulatory Visit: Admitting: Pulmonary Disease

## 2024-06-29 ENCOUNTER — Encounter: Payer: Self-pay | Admitting: Pulmonary Disease

## 2024-06-29 VITALS — BP 140/92 | HR 66 | Temp 97.6°F | Ht 66.0 in | Wt 208.8 lb

## 2024-06-29 DIAGNOSIS — R0602 Shortness of breath: Secondary | ICD-10-CM | POA: Diagnosis not present

## 2024-06-29 DIAGNOSIS — R058 Other specified cough: Secondary | ICD-10-CM | POA: Diagnosis not present

## 2024-06-29 DIAGNOSIS — R911 Solitary pulmonary nodule: Secondary | ICD-10-CM | POA: Diagnosis not present

## 2024-06-29 LAB — NITRIC OXIDE: Nitric Oxide: 15

## 2024-06-29 NOTE — Patient Instructions (Signed)
 VISIT SUMMARY:  During your visit, we discussed your concerns about a persistent cough and potential lung issues. We reviewed your history, including your past smoking habit, family history of cancer, and exposure to pottery dust. We also went over the results of your recent CT scan and other diagnostic tests.  YOUR PLAN:  -SOLITARY PULMONARY NODULE, RIGHT LUNG: A small 4 mm nodule was found in your right lung during a CT scan in August. This nodule is likely benign and may be due to inhaled particles. There is no evidence of lung cancer or silicosis. We will monitor this nodule with a follow-up CT scan in one year.  -POST-INFECTIOUS COUGH (RESOLVED): Your persistent cough, which started after your trip to [place] , has resolved. It was likely caused by a viral infection. Since there is no current evidence of airway inflammation or asthma, no further treatment is needed.  INSTRUCTIONS:  Please schedule a follow-up CT scan in one year to monitor the solitary pulmonary nodule in your right lung.

## 2024-06-29 NOTE — Progress Notes (Signed)
 Subjective:    Patient ID: Justin Johnston, male    DOB: 01-Feb-1946, 78 y.o.   MRN: [id number]  Patient Care Team: Henry Ingle, MD as PCP - General (Internal Medicine)  Chief Complaint  Patient presents with   Consult    Patient reports severe cough in July, lasting about 7 weeks. Reports he was tested for COVID and RSV, both were negative. 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 He is concerned about Silicosis.     BACKGROUND: Patient is a 78 year old remote former smoker who presents for general lung health evaluation.  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  He is kindly referred by Dr. Lamar Fitch.  HPI Discussed the use of AI scribe software for clinical note transcription with the patient, who gave verbal consent to proceed.  History of Present Illness   [name] is a 78 year old male with coronary artery disease who presents with concerns about a persistent cough (now resolved) and potential lung issues.  He experienced a persistent cough that began during a trip to [place]  in July 2025. The cough was severe and continuous, leading to an evaluation at [facility]. Diagnostic tests, including x-rays, RSV, and COVID tests, were conducted but did not yield a definitive diagnosis. Initial treatment with benzonatate  was ineffective, but hydrocodone  provided some relief. The cough resolved by late September 2025.  He has a significant family history of cancer, with both parents having died from cancer. His mother had stage four lung cancer, and his father also succumbed to cancer. Both parents were smokers who had quit 25 years prior to their diagnoses. He himself was a smoker for 15 years, quitting in 2002 following a triple bypass surgery.  He is concerned about his exposure to pottery dust in his current occupation as a Veterinary surgeon, although he wears a mask to mitigate this  risk. 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  No known allergies and no recent symptoms of cough or shortness of breath with seasonal changes, although he experienced similar symptoms annually in January or February until four years ago.  A CT scan conducted in August 2025, independently reviewed, revealed a small nodule (4 mm), no evidence of silicosis or lung cancer. He has undergone a treadmill test and echocardiogram as part of his cardiac evaluation, which are documented in his medical chart.      Review of Systems A 10 point review of systems was performed and it is as noted above otherwise negative.   Past Medical History:  Diagnosis Date   Arthritis    Coronary artery disease    Hypertension    Rocky Mountain spotted fever    Thyroid  disease     Past Surgical History:  Procedure Laterality Date   APPENDECTOMY     CARDIAC SURGERY     CORONARY ARTERY BYPASS GRAFT     KNEE SURGERY     TONSILLECTOMY      Patient Active Problem List   Diagnosis Date Noted   Anal fissure 06/07/2023   Thyroid  disease 11/13/2021   Arthritis 11/13/2021   Coronary artery disease 11/16/2018   Status post coronary artery bypass graft 11/16/2018   Dyslipidemia 11/16/2018   Essential hypertension 11/16/2018    Family History  Problem Relation Age of Onset   Lung cancer  Mother    Cancer Father     Social History   Tobacco Use   Smoking status: Former    Current packs/day: 0.00    Types: Cigarettes    Quit date: 03/2001    Years since quitting: 23.2    Passive exposure: Past   Smokeless tobacco: Never   Tobacco comments:    Smoked 1.5 pack a day for about 15 years.  Substance Use Topics   Alcohol use: Yes    No Known Allergies  Current Meds  Medication Sig   albuterol  (VENTOLIN  HFA) 108 (90 Base) MCG/ACT inhaler Inhale 2 puffs into the lungs every 6 (six) hours as needed for wheezing  or shortness of breath.   amLODipine  (NORVASC ) 5 MG tablet Take 5 mg by mouth daily.   Bempedoic Acid-Ezetimibe  (NEXLIZET ) 180-10 MG TABS Take 1 tablet by mouth daily in the afternoon.   levothyroxine (SYNTHROID, LEVOTHROID) 50 MCG tablet Take 50 mcg by mouth daily before breakfast.    lisinopril (PRINIVIL,ZESTRIL) 20 MG tablet Take 1 tablet by mouth daily.    Multiple Vitamins-Minerals (PRESERVISION AREDS 2 PO) Take 2 capsules by mouth daily.   nitroGLYCERIN  (NITROSTAT ) 0.4 MG SL tablet Place 1 tablet (0.4 mg total) under the tongue every 5 (five) minutes as needed for chest pain.   rosuvastatin (CRESTOR) 40 MG tablet Take 40 mg by mouth daily.    Immunization History  Administered Date(s) Administered   PFIZER(Purple Top)SARS-COV-2 Vaccination 10/19/2019, 11/09/2019        Objective:     BP (!) 140/92   Pulse 66   Temp 97.6 F (36.4 C) (Temporal)   Ht 5' 6 (1.676 m)   Wt 208 lb 12.8 oz (94.7 kg)   SpO2 99%   BMI 33.70 kg/m   SpO2: 99 %  GENERAL: Well-developed, well-nourished gentleman, no acute distress.  Fully ambulatory.  No conversational dyspnea. HEAD: Normocephalic, atraumatic.  EYES: Pupils equal, round, reactive to light.  No scleral icterus.  MOUTH: Dentition intact, oral mucosa moist.  No thrush. NECK: Supple. No thyromegaly. Trachea midline. No JVD.  No adenopathy. PULMONARY: Good air entry bilaterally.  No adventitious sounds. CARDIOVASCULAR: S1 and S2. Regular rate and rhythm.  No rubs, murmurs or gallops heard. ABDOMEN: Benign. MUSCULOSKELETAL: No joint deformity, no clubbing, no edema.  NEUROLOGIC: No overt focal deficit, no gait disturbance, speech is fluent. SKIN: Intact,warm,dry. PSYCH: Mood and behavior normal.  Representative image from CT performed 19 May 2024 showing a 4 mm subpleural lung nodule on the right upper lobe (arrow):   Lab Results  Component Value Date   NITRICOXIDE 15 06/29/2024  *Low level of nitric oxide indicating no  significant type II inflammation noted.  Assessment & Plan:     ICD-10-CM   1. Shortness of breath  R06.02 Nitric oxide    2. Post-viral cough syndrome - RESOLVED  R05.8     3. Lung nodule seen on imaging study  R91.1 CT CHEST WO CONTRAST    CANCELED: CT CHEST WO CONTRAST     Orders Placed This Encounter  Procedures   CT CHEST WO CONTRAST    Standing Status:   Future    Expected Date:   05/22/2025    Expiration Date:   08/20/2025    Preferred imaging location?:   [facility]   Nitric oxide    Discussion:    Solitary pulmonary nodule, right lung A 4 mm solitary pulmonary nodule was identified in the right lung on a CT scan in  August. It is likely benign, possibly due to inhaled particulates. No evidence of lung cancer or silicosis. - Order follow-up CT scan in one year to monitor the nodule.  Post-infectious cough (resolved) The cough began during a trip to [place]  in July after an acute illness and resolved by the end of September. It was likely post-infectious following a viral infection, with negative RSV and COVID-19 workup. No current evidence of airway inflammation or asthma. - No further treatment needed.  - Monitor for recurrence.     Advised if symptoms do not improve or worsen, to please contact office for sooner follow up or seek emergency care.    I spent 61 minutes of dedicated to the care of this patient on the date of this encounter to include pre-visit review of records, face-to-face time with the patient discussing conditions above, post visit ordering of testing, clinical documentation with the electronic health record, making appropriate referrals as documented, and communicating necessary findings to members of the patients care team.   C. Leita Sanders, MD Advanced Bronchoscopy [facility]    *This note was dictated using voice recognition software/Dragon.  Despite best efforts to proofread, errors can occur which can change  the meaning. Any transcriptional errors that result from this process are unintentional and may not be fully corrected at the time of dictation.

## 2024-08-07 ENCOUNTER — Ambulatory Visit: Admitting: Cardiology

## 2024-10-02 ENCOUNTER — Encounter: Payer: Self-pay | Admitting: Cardiology

## 2024-10-02 ENCOUNTER — Ambulatory Visit: Attending: Cardiology | Admitting: Cardiology

## 2024-10-02 VITALS — BP 130/80 | HR 67 | Ht 66.0 in | Wt 209.0 lb

## 2024-10-02 DIAGNOSIS — I10 Essential (primary) hypertension: Secondary | ICD-10-CM

## 2024-10-02 DIAGNOSIS — E079 Disorder of thyroid, unspecified: Secondary | ICD-10-CM

## 2024-10-02 DIAGNOSIS — Z951 Presence of aortocoronary bypass graft: Secondary | ICD-10-CM | POA: Diagnosis not present

## 2024-10-02 DIAGNOSIS — I2510 Atherosclerotic heart disease of native coronary artery without angina pectoris: Secondary | ICD-10-CM | POA: Diagnosis not present

## 2024-10-02 DIAGNOSIS — E785 Hyperlipidemia, unspecified: Secondary | ICD-10-CM

## 2024-10-02 NOTE — Progress Notes (Signed)
 " Cardiology Office Note:    Date:  10/02/2024   ID:  Justin Johnston, DOB Sep 30, 1945, MRN [id number]  PCP:  Henry Ingle, MD  Cardiologist:  Lamar Fitch, MD    Referring MD: Henry Ingle, MD   Chief Complaint  Patient presents with   Follow-up    History of Present Illness:    Justin Johnston is a 79 y.o. male past medical history significant for coronary disease.  In 2012 he did have coronary bypass graft, last evaluation for his coronary artery disease has been done in June 2025 which was no evidence of ischemia, additional problem include arthritis, dyslipidemia, essential hypertension.  Comes in today 2 months follow-up, doing well last time I have seen him he just came back from a trip to [place]  where he gets sick.  He supposed to go to [place] and [place] but decided not to do that scare about potential complication from health issues.  Denies having any chest pain tightness squeezing pressure burning chest.  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  He is very busy working, complain of having arthritis  Past Medical History:  Diagnosis Date   Arthritis    Coronary artery disease    Hypertension    [place] spotted fever    Thyroid  disease     Past Surgical History:  Procedure Laterality Date   APPENDECTOMY     CARDIAC SURGERY     CORONARY ARTERY BYPASS GRAFT     KNEE SURGERY     TONSILLECTOMY      Current Medications: Active Medications[1]   Allergies:   Patient has no known allergies.   Social History   Socioeconomic History   Marital status: Single    Spouse name: Not on file   Number of children: Not on file   Years of education: Not on file   Highest education level: Not on file  Occupational History   Not on file  Tobacco Use   Smoking status: Former    Current packs/day: 0.00    Types: Cigarettes    Quit date: 03/2001    Years since quitting: 23.5    Passive exposure: Past   Smokeless tobacco: Never   Tobacco comments:     Smoked 1.5 pack a day for about 15 years.  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 ROS:   Please see the history of present illness.    All 14 point review of systems negative except as described per history of present illness  EKGs/Labs/Other Studies Reviewed:  Recent Labs: 05/09/2024: ALT 60; BUN 14; Creatinine, Ser 0.78; Hemoglobin 15.0; Platelets 179; Potassium 4.5; Sodium 141  Recent Lipid Panel    Component Value Date/Time   CHOL 154 01/31/2023 1456   TRIG 98 01/31/2023 1456   HDL 42 01/31/2023 1456   CHOLHDL 3.7 01/31/2023 1456   LDLCALC 94 01/31/2023 1456    Physical Exam:    VS:  BP 130/80   Pulse 67   Ht 5' 6 (1.676 m)   Wt 209 lb (94.8 kg)   SpO2 98%   BMI 33.73 kg/m     Wt Readings from Last 3 Encounters:  10/02/24 209 lb (94.8 kg)  06/29/24 208 lb 12.8 oz (94.7 kg)  05/09/24 206 lb 12.8 oz (93.8 kg)     GEN:  Well nourished, well developed in no acute distress HEENT: Normal NECK: No JVD; No carotid bruits LYMPHATICS: No lymphadenopathy CARDIAC: RRR, no murmurs, no rubs, no gallops RESPIRATORY:  Clear to auscultation without rales, wheezing or rhonchi   ABDOMEN: Soft, non-tender, non-distended MUSCULOSKELETAL:  No edema; No deformity  SKIN: Warm and dry LOWER EXTREMITIES: no swelling NEUROLOGIC:  Alert and oriented x 3 PSYCHIATRIC:  Normal affect   ASSESSMENT:    1. Dyslipidemia   2. Coronary artery disease involving native coronary artery of native heart without angina pectoris   3. Essential hypertension   4. Thyroid  disease   5. Status post coronary artery bypass graft    PLAN:    In order of problems listed above:  Coronary disease stable continue guideline directed medical therapy which I continue. Dyslipidemia will recheck his fasting lipid profile.  He brought the issue of potentially having muscle aches and pains and aches in joints secondary to cholesterol medication which could be the case.  I told him that 2 options anticipate need to switch him to PCSK9 agent therefore I would wait for results of his cholesterol with cholesterol still elevated in spite of maximal oral therapy by switching to PCSK9 agent. Essential hypertension blood pressure well-controlled continue present management. Thyroid  disorder stable.   Medication Adjustments/Labs and Tests Ordered: Current medicines are reviewed at length with the patient today.  Concerns regarding medicines are outlined above.  Orders Placed This Encounter  Procedures   ALT   AST   Lipid panel   Medication changes: No orders of the defined types were placed in this encounter.   Signed, Lamar DOROTHA Fitch, MD, [facility] 10/02/2024 11:59 AM    [facility]    [1]  Current Meds  Medication Sig   amLODipine  (NORVASC ) 5 MG tablet Take 5 mg by mouth daily.   Bempedoic Acid-Ezetimibe  (NEXLIZET ) 180-10 MG TABS Take 1 tablet by mouth daily in the afternoon.   levothyroxine (SYNTHROID, LEVOTHROID) 50 MCG tablet Take 50 mcg by mouth daily before breakfast.    lisinopril (PRINIVIL,ZESTRIL) 20 MG tablet Take 1 tablet by mouth daily.    Multiple  Vitamins-Minerals (PRESERVISION AREDS 2 PO) Take 2 capsules by mouth daily.   nitroGLYCERIN  (NITROSTAT ) 0.4 MG SL tablet Place 1 tablet (0.4 mg total) under the tongue every 5 (five) minutes as needed for chest pain.   rosuvastatin (CRESTOR) 40 MG tablet Take 40 mg by mouth daily.   traMADol (ULTRAM) 50 MG tablet Take 50 mg by mouth 2 (two) times daily.   "

## 2024-10-02 NOTE — Patient Instructions (Addendum)
 Medication Instructions:  Your physician recommends that you continue on your current medications as directed. Please refer to the Current Medication list given to you today.  *If you need a refill on your cardiac medications before your next appointment, please call your pharmacy*   Lab Work: 3rd Floor   [address]  Your physician recommends that you return for lab work in:    You need to have labs done when you are fasting.  You can come Monday through Friday 8:00 am to 11:30AM and 1:00 to 4:00. You do not need to make an appointment as the order has already been placed.     Testing/Procedures: None Ordered   Follow-Up: At [facility], you and your health needs are our priority.  As part of our continuing mission to provide you with exceptional heart care, we have created designated Provider Care Teams.  These Care Teams include your primary Cardiologist (physician) and Advanced Practice Providers (APPs -  Physician Assistants and Nurse Practitioners) who all work together to provide you with the care you need, when you need it.  We recommend signing up for the patient portal called "MyChart".  Sign up information is provided on this After Visit Summary.  MyChart is used to connect with patients for Virtual Visits (Telemedicine).  Patients are able to view lab/test results, encounter notes, upcoming appointments, etc.  Non-urgent messages can be sent to your provider as well.   To learn more about what you can do with MyChart, go to [email or web address].    Your next appointment:   6 month(s)  The format for your next appointment:   In Person  Provider:   Gypsy Balsam, MD    Other Instructions NA

## 2024-10-10 LAB — ALT: ALT: 26 IU/L (ref 0–44)

## 2024-10-10 LAB — LIPID PANEL
Chol/HDL Ratio: 4.3 ratio (ref 0.0–5.0)
Cholesterol, Total: 152 mg/dL (ref 100–199)
HDL: 35 mg/dL — ABNORMAL LOW
LDL Chol Calc (NIH): 95 mg/dL (ref 0–99)
Triglycerides: 119 mg/dL (ref 0–149)
VLDL Cholesterol Cal: 22 mg/dL (ref 5–40)

## 2024-10-10 LAB — AST: AST: 18 IU/L (ref 0–40)

## 2024-10-12 ENCOUNTER — Ambulatory Visit: Payer: Self-pay | Admitting: Cardiology

## 2024-10-12 DIAGNOSIS — E785 Hyperlipidemia, unspecified: Secondary | ICD-10-CM

## 2024-10-17 MED ORDER — EZETIMIBE 10 MG PO TABS: 10.0000 mg | ORAL_TABLET | Freq: Every day | ORAL | 3 refills | Status: AC

## 2024-10-19 NOTE — Telephone Encounter (Signed)
 Patient read MyChart message on 10/17/24.  Alan, RN
